# Patient Record
Sex: Female | Born: 1970 | Race: White | Hispanic: No | Marital: Single | Smoking: Former smoker
Health system: Southern US, Community
[De-identification: ages and names within clinical notes are randomized; demographics above are authoritative.]

## PROBLEM LIST (undated history)

## (undated) DIAGNOSIS — N83209 Unspecified ovarian cyst, unspecified side: Secondary | ICD-10-CM

## (undated) DIAGNOSIS — C50919 Malignant neoplasm of unspecified site of unspecified female breast: Secondary | ICD-10-CM

## (undated) HISTORY — PX: TUBAL LIGATION: SHX77

## (undated) HISTORY — PX: ABDOMINAL HYSTERECTOMY: SHX81

---

## 2004-09-26 ENCOUNTER — Emergency Department: Payer: Self-pay | Admitting: Emergency Medicine

## 2004-12-12 ENCOUNTER — Emergency Department: Payer: Self-pay | Admitting: Emergency Medicine

## 2005-02-11 ENCOUNTER — Emergency Department: Payer: Self-pay | Admitting: General Practice

## 2005-04-20 ENCOUNTER — Emergency Department: Payer: Self-pay | Admitting: General Practice

## 2008-07-21 ENCOUNTER — Emergency Department: Payer: Self-pay | Admitting: Internal Medicine

## 2009-06-17 ENCOUNTER — Emergency Department: Payer: Self-pay | Admitting: Emergency Medicine

## 2010-01-04 ENCOUNTER — Emergency Department: Payer: Self-pay | Admitting: Emergency Medicine

## 2010-02-26 ENCOUNTER — Ambulatory Visit: Payer: Self-pay | Admitting: Otolaryngology

## 2010-10-30 ENCOUNTER — Emergency Department: Payer: Self-pay | Admitting: Unknown Physician Specialty

## 2011-02-07 ENCOUNTER — Ambulatory Visit: Payer: Self-pay | Admitting: Family Medicine

## 2012-04-14 ENCOUNTER — Emergency Department: Payer: Self-pay | Admitting: Emergency Medicine

## 2012-04-14 LAB — COMPREHENSIVE METABOLIC PANEL
Albumin: 4.1 g/dL (ref 3.4–5.0)
Alkaline Phosphatase: 53 U/L (ref 50–136)
Anion Gap: 9 (ref 7–16)
Calcium, Total: 9.1 mg/dL (ref 8.5–10.1)
Chloride: 110 mmol/L — ABNORMAL HIGH (ref 98–107)
Co2: 21 mmol/L (ref 21–32)
Creatinine: 0.97 mg/dL (ref 0.60–1.30)
EGFR (Non-African Amer.): >60
Potassium: 4.7 mmol/L (ref 3.5–5.1)
Sodium: 140 mmol/L (ref 136–145)

## 2012-04-14 LAB — TROPONIN I: Troponin-I: <0.02 ng/mL

## 2012-04-14 LAB — CBC WITH DIFFERENTIAL/PLATELET
Basophil #: 0.1 10*3/uL (ref 0.0–0.1)
Eosinophil %: 2.7 %
HGB: 14.8 g/dL (ref 12.0–16.0)
MCH: 29.7 pg (ref 26.0–34.0)
MCHC: 33.5 g/dL (ref 32.0–36.0)
MCV: 89 fL (ref 80–100)
Monocyte #: 0.6 x10 3/mm (ref 0.2–0.9)
Platelet: 183 10*3/uL (ref 150–440)
RBC: 4.97 10*6/uL (ref 3.80–5.20)
RDW: 13.7 % (ref 11.5–14.5)

## 2012-04-14 LAB — URINALYSIS, COMPLETE
Bacteria: NONE SEEN
Blood: NEGATIVE
Leukocyte Esterase: NEGATIVE
Nitrite: NEGATIVE
Ph: 5 (ref 4.5–8.0)
Protein: NEGATIVE
RBC,UR: 1 /HPF (ref 0–5)
Squamous Epithelial: 1
WBC UR: <1 /HPF (ref 0–5)

## 2012-04-14 LAB — PREGNANCY, URINE: Pregnancy Test, Urine: NEGATIVE m[IU]/mL

## 2012-04-14 LAB — DRUG SCREEN, URINE
Amphetamines, Ur Screen: NEGATIVE (ref ?–1000)
Barbiturates, Ur Screen: NEGATIVE (ref ?–200)
Cannabinoid 50 Ng, Ur ~~LOC~~: NEGATIVE (ref ?–50)
Opiate, Ur Screen: NEGATIVE (ref ?–300)
Phencyclidine (PCP) Ur S: NEGATIVE (ref ?–25)
Tricyclic, Ur Screen: NEGATIVE (ref ?–1000)

## 2013-06-23 ENCOUNTER — Encounter: Payer: Self-pay | Admitting: *Deleted

## 2013-06-23 ENCOUNTER — Ambulatory Visit: Payer: Self-pay | Admitting: Family Medicine

## 2013-06-23 ENCOUNTER — Ambulatory Visit: Payer: Self-pay

## 2013-07-14 ENCOUNTER — Encounter: Payer: Self-pay | Admitting: General Surgery

## 2013-07-14 ENCOUNTER — Ambulatory Visit (INDEPENDENT_AMBULATORY_CARE_PROVIDER_SITE_OTHER): Payer: PRIVATE HEALTH INSURANCE | Admitting: General Surgery

## 2013-07-14 VITALS — BP 120/74 | HR 74 | Resp 12 | Ht 64.0 in | Wt 174.0 lb

## 2013-07-14 DIAGNOSIS — N63 Unspecified lump in unspecified breast: Secondary | ICD-10-CM | POA: Insufficient documentation

## 2013-07-14 NOTE — Patient Instructions (Signed)
Patient may take tylenol, advil, or aleve as needed for pain or discomfort. 

## 2013-07-14 NOTE — Progress Notes (Signed)
Patient ID: Caitlin Holt, female   DOB: 1971/10/18, 42 y.o.   MRN: [id number]  Chief Complaint  Patient presents with  . Other    mammogram    HPI Caitlin Holt is a 42 y.o. female who presents for a breast evaluation. The most recent mammogram and right breast  ultrasound was done on 06/23/13. Patient does perform regular self breast checks and gets regular mammograms done. Patient states she felt an lump on her right breast last year.  She states it is always hurting like an throbbing pain in the breasts.  HPI  History reviewed. No pertinent past medical history.  Past Surgical History  Procedure Laterality Date  . Tubal ligation      Family History  Problem Relation Age of Onset  . Breast cancer Mother   . Breast cancer Maternal Grandmother     Social History History  Substance Use Topics  . Smoking status: Current Every Day Smoker -- 1.00 packs/day for 15 years  . Smokeless tobacco: Never Used  . Alcohol Use: Yes    No Known Allergies  No current outpatient prescriptions on file.   No current facility-administered medications for this visit.    Review of Systems Review of Systems  Constitutional: Negative.   Respiratory: Negative.   Cardiovascular: Negative.     Blood pressure 120/74, pulse 74, resp. rate 12, height 5\' 4"  (1.626 m), weight 174 lb (78.926 kg), last menstrual period 07/03/2013.  Physical Exam Physical Exam  Constitutional: She is oriented to person, place, and time. She appears well-developed and well-nourished.  Neck: Neck supple.  Cardiovascular: Normal rate and regular rhythm.   Pulmonary/Chest: Breath sounds normal. Right breast exhibits no inverted nipple, no mass, no nipple discharge, no skin change and no tenderness. Left breast exhibits no inverted nipple, no mass, no nipple discharge, no skin change and no tenderness. Breasts are asymmetrical (left breast 1 cup size bigger than right).  Mild thickening in the upper outer  quadrant right breast.Left breast thickening in the upper outer quadrant.  Lymphadenopathy:    She has no cervical adenopathy.    She has no axillary adenopathy.  Neurological: She is alert and oriented to person, place, and time.  Skin: Skin is warm and dry.    Data Reviewed Bilateral mammogram dated June 23, 2013 including focal spot compression dated July 01, 2013 views of the right breast showed no discernible abnormality  Her ultrasound dated June 29, 2013 identified no abnormality.  Assessment    Benign breast exam, focal symmetrical a broader quadrant thickening associated with normal breast parenchyma.     Plan    The patient was encouraged to continue  Monthly breast self examination as well as annual screening mammograms.       Earline Mayotte 07/14/2013, 10:35 PM

## 2013-08-30 ENCOUNTER — Emergency Department: Payer: Self-pay | Admitting: Emergency Medicine

## 2014-01-29 ENCOUNTER — Emergency Department: Payer: Self-pay | Admitting: Emergency Medicine

## 2014-02-20 ENCOUNTER — Emergency Department: Payer: Self-pay | Admitting: Emergency Medicine

## 2014-02-20 LAB — URINALYSIS, COMPLETE
BILIRUBIN, UR: NEGATIVE
Bacteria: NONE SEEN
Blood: NEGATIVE
Glucose,UR: NEGATIVE mg/dL (ref 0–75)
Ketone: NEGATIVE
LEUKOCYTE ESTERASE: NEGATIVE
Nitrite: NEGATIVE
PROTEIN: NEGATIVE
Ph: 5 (ref 4.5–8.0)
RBC,UR: <1 /HPF (ref 0–5)
Specific Gravity: 1.016 (ref 1.003–1.030)
WBC UR: <1 /HPF (ref 0–5)

## 2014-07-27 ENCOUNTER — Ambulatory Visit: Payer: Self-pay

## 2014-10-03 ENCOUNTER — Encounter: Payer: Self-pay | Admitting: General Surgery

## 2015-08-02 ENCOUNTER — Ambulatory Visit: Payer: Self-pay | Attending: Oncology

## 2015-08-02 ENCOUNTER — Ambulatory Visit
Admission: RE | Admit: 2015-08-02 | Discharge: 2015-08-02 | Disposition: A | Discharge status: Home / Self Care | Payer: Self-pay | Source: Ambulatory Visit | Attending: Oncology | Admitting: Oncology

## 2015-08-02 ENCOUNTER — Encounter (INDEPENDENT_AMBULATORY_CARE_PROVIDER_SITE_OTHER): Payer: Self-pay

## 2015-08-02 VITALS — BP 122/81 | HR 63 | Temp 98.1°F | Resp 18 | Ht 64.57 in | Wt 203.0 lb

## 2015-08-02 DIAGNOSIS — Z0000 Encounter for general adult medical examination without abnormal findings: Secondary | ICD-10-CM

## 2015-08-02 NOTE — Progress Notes (Signed)
Subjective:     Patient ID: Caitlin Holt, female   DOB: 1971/06/30, 44 y.o.   MRN: [id number]  HPI   Review of Systems     Objective:   Physical Exam  Pulmonary/Chest: Right breast exhibits no inverted nipple, no mass, no nipple discharge, no skin change and no tenderness. Left breast exhibits no inverted nipple, no mass, no nipple discharge, no skin change and no tenderness. Breasts are symmetrical.       Assessment:     44 year old  patient presents for [facility] clinic visit.  Patient screened, and meets BCCCP eligibility.  Patient does not have insurance, Medicare or Medicaid.  Handout given on Affordable Care Act.  CBE unremarkable.  Instructed patient on breast self-exam using teach back method Patient is being seen at [facility] for uterine fibroids, and left ovarian cyst.  Mother is breast cancer survivor, and tested negative for BRCA mutation.    Plan:     Sent for bilateral screening mammogram.

## 2015-08-04 NOTE — Progress Notes (Signed)
Patient ID: Caitlin Holt, female   DOB: [birth year], 44 y.o.   MRN: [id number] Letter mailed from [facility] to notify of normal mammogram results.  Patient to return in one year for annual screening.  Copy to [facility].

## 2015-12-26 ENCOUNTER — Emergency Department
Admission: EM | Admit: 2015-12-26 | Discharge: 2015-12-26 | Disposition: A | Discharge status: Home / Self Care | Payer: Self-pay | Attending: Emergency Medicine | Admitting: Emergency Medicine

## 2015-12-26 ENCOUNTER — Encounter: Payer: Self-pay | Admitting: Emergency Medicine

## 2015-12-26 ENCOUNTER — Emergency Department: Payer: Self-pay

## 2015-12-26 DIAGNOSIS — Z3202 Encounter for pregnancy test, result negative: Secondary | ICD-10-CM | POA: Insufficient documentation

## 2015-12-26 DIAGNOSIS — R102 Pelvic and perineal pain: Secondary | ICD-10-CM

## 2015-12-26 DIAGNOSIS — R531 Weakness: Secondary | ICD-10-CM | POA: Insufficient documentation

## 2015-12-26 DIAGNOSIS — D259 Leiomyoma of uterus, unspecified: Secondary | ICD-10-CM | POA: Insufficient documentation

## 2015-12-26 DIAGNOSIS — F17200 Nicotine dependence, unspecified, uncomplicated: Secondary | ICD-10-CM | POA: Insufficient documentation

## 2015-12-26 DIAGNOSIS — N83202 Unspecified ovarian cyst, left side: Secondary | ICD-10-CM | POA: Insufficient documentation

## 2015-12-26 DIAGNOSIS — R42 Dizziness and giddiness: Secondary | ICD-10-CM | POA: Insufficient documentation

## 2015-12-26 HISTORY — DX: Unspecified ovarian cyst, unspecified side: N83.209

## 2015-12-26 LAB — BASIC METABOLIC PANEL
ANION GAP: 4 — AB (ref 5–15)
BUN: 15 mg/dL (ref 6–20)
CALCIUM: 9.1 mg/dL (ref 8.9–10.3)
CO2: 25 mmol/L (ref 22–32)
Chloride: 109 mmol/L (ref 101–111)
Creatinine, Ser: 1 mg/dL (ref 0.44–1.00)
GFR calc Af Amer: >60 mL/min (ref >60)
Glucose, Bld: 119 mg/dL — ABNORMAL HIGH (ref 65–99)
POTASSIUM: 4 mmol/L (ref 3.5–5.1)
SODIUM: 138 mmol/L (ref 135–145)

## 2015-12-26 LAB — CBC WITH DIFFERENTIAL/PLATELET
BASOS ABS: 0 10*3/uL (ref 0–0.1)
BASOS PCT: 1 %
EOS PCT: 1 %
Eosinophils Absolute: 0.1 10*3/uL (ref 0–0.7)
HCT: 43.7 % (ref 35.0–47.0)
Hemoglobin: 14.6 g/dL (ref 12.0–16.0)
LYMPHS PCT: 14 %
Lymphs Abs: 1.1 10*3/uL (ref 1.0–3.6)
MCH: 28.5 pg (ref 26.0–34.0)
MCHC: 33.4 g/dL (ref 32.0–36.0)
MCV: 85.5 fL (ref 80.0–100.0)
MONO ABS: 0.6 10*3/uL (ref 0.2–0.9)
Monocytes Relative: 7 %
Neutro Abs: 6 10*3/uL (ref 1.4–6.5)
Neutrophils Relative %: 77 %
PLATELETS: 172 10*3/uL (ref 150–440)
RBC: 5.11 MIL/uL (ref 3.80–5.20)
RDW: 14.4 % (ref 11.5–14.5)
WBC: 7.8 10*3/uL (ref 3.6–11.0)

## 2015-12-26 LAB — URINALYSIS COMPLETE WITH MICROSCOPIC (ARMC ONLY)
BILIRUBIN URINE: NEGATIVE
Glucose, UA: NEGATIVE mg/dL
KETONES UR: NEGATIVE mg/dL
LEUKOCYTES UA: NEGATIVE
NITRITE: NEGATIVE
PH: 6 (ref 5.0–8.0)
PROTEIN: NEGATIVE mg/dL
SPECIFIC GRAVITY, URINE: 1.002 — AB (ref 1.005–1.030)

## 2015-12-26 LAB — POCT PREGNANCY, URINE: Preg Test, Ur: NEGATIVE

## 2015-12-26 MED ORDER — SODIUM CHLORIDE 0.9 % IV SOLN
1000.0000 mL | Freq: Once | INTRAVENOUS | Status: AC
  Administered 2015-12-26: 1000 mL via INTRAVENOUS

## 2015-12-26 MED ORDER — MORPHINE SULFATE (PF) 4 MG/ML IV SOLN
INTRAVENOUS | Status: AC
  Administered 2015-12-26: 4 mg via INTRAVENOUS
  Filled 2015-12-26: qty 1

## 2015-12-26 MED ORDER — MORPHINE SULFATE (PF) 4 MG/ML IV SOLN
4.0000 mg | Freq: Once | INTRAVENOUS | Status: AC
  Administered 2015-12-26: 4 mg via INTRAVENOUS

## 2015-12-26 MED ORDER — OXYCODONE-ACETAMINOPHEN 5-325 MG PO TABS: 2.0000 | ORAL_TABLET | Freq: Four times a day (QID) | ORAL | Status: DC | PRN

## 2015-12-26 MED ORDER — HYDROMORPHONE HCL 1 MG/ML IJ SOLN
1.0000 mg | Freq: Once | INTRAMUSCULAR | Status: AC
  Administered 2015-12-26: 1 mg via INTRAVENOUS
  Filled 2015-12-26: qty 1

## 2015-12-26 NOTE — Discharge Instructions (Signed)
Pelvic Pain, Female Female pelvic pain can be caused by many different things and start from a variety of places. Pelvic pain refers to pain that is located in the lower half of the abdomen and between your hips. The pain may occur over a short period of time (acute) or may be reoccurring (chronic). The cause of pelvic pain may be related to disorders affecting the female reproductive organs (gynecologic), but it may also be related to the bladder, kidney stones, an intestinal complication, or muscle or skeletal problems. Getting help right away for pelvic pain is important, especially if there has been severe, sharp, or a sudden onset of unusual pain. It is also important to get help right away because some types of pelvic pain can be life threatening.  CAUSES  Below are only some of the causes of pelvic pain. The causes of pelvic pain can be in one of several categories.   Gynecologic.  Pelvic inflammatory disease.  Sexually transmitted infection.  Ovarian cyst or a twisted ovarian ligament (ovarian torsion).  Uterine lining that grows outside the uterus (endometriosis).  Fibroids, cysts, or tumors.  Ovulation.  Pregnancy.  Pregnancy that occurs outside the uterus (ectopic pregnancy).  Miscarriage.  Labor.  Abruption of the placenta or ruptured uterus.  Infection.  Uterine infection (endometritis).  Bladder infection.  Diverticulitis.  Miscarriage related to a uterine infection (septic abortion).  Bladder.  Inflammation of the bladder (cystitis).  Kidney stone(s).  Gastrointestinal.  Constipation.  Diverticulitis.  Neurologic.  Trauma.  Feeling pelvic pain because of mental or emotional causes (psychosomatic).  Cancers of the bowel or pelvis. EVALUATION  Your caregiver will want to take a careful history of your concerns. This includes recent changes in your health, a careful gynecologic history of your periods (menses), and a sexual history. Obtaining  your family history and medical history is also important. Your caregiver may suggest a pelvic exam. A pelvic exam will help identify the location and severity of the pain. It also helps in the evaluation of which organ system may be involved. In order to identify the cause of the pelvic pain and be properly treated, your caregiver may order tests. These tests may include:   A pregnancy test.  Pelvic ultrasonography.  An X-ray exam of the abdomen.  A urinalysis or evaluation of vaginal discharge.  Blood tests. HOME CARE INSTRUCTIONS   Only take over-the-counter or prescription medicines for pain, discomfort, or fever as directed by your caregiver.   Rest as directed by your caregiver.   Eat a balanced diet.   Drink enough fluids to make your urine clear or pale yellow, or as directed.   Avoid sexual intercourse if it causes pain.   Apply warm or cold compresses to the lower abdomen depending on which one helps the pain.   Avoid stressful situations.   Keep a journal of your pelvic pain. Write down when it started, where the pain is located, and if there are things that seem to be associated with the pain, such as food or your menstrual cycle.  Follow up with your caregiver as directed.  SEEK MEDICAL CARE IF:  Your medicine does not help your pain.  You have abnormal vaginal discharge. SEEK IMMEDIATE MEDICAL CARE IF:   You have heavy bleeding from the vagina.   Your pelvic pain increases.   You feel light-headed or faint.   You have chills.   You have pain with urination or blood in your urine.   You have uncontrolled  diarrhea or vomiting.   You have a fever or persistent symptoms for more than 3 days.  You have a fever and your symptoms suddenly get worse.   You are being physically or sexually abused.   This information is not intended to replace advice given to you by your health care provider. Make sure you discuss any questions you have with  your health care provider.   Document Released: 10/15/2004 Document Revised: 08/09/2015 Document Reviewed: 03/09/2012 Elsevier Interactive Patient Education 2016 Elsevier Inc. Ovarian Cyst An ovarian cyst is a fluid-filled sac that forms on an ovary. The ovaries are small organs that produce eggs in women. Various types of cysts can form on the ovaries. Most are not cancerous. Many do not cause problems, and they often go away on their own. Some may cause symptoms and require treatment. Common types of ovarian cysts include:  Functional cysts--These cysts may occur every month during the menstrual cycle. This is normal. The cysts usually go away with the next menstrual cycle if the woman does not get pregnant. Usually, there are no symptoms with a functional cyst.  Endometrioma cysts--These cysts form from the tissue that lines the uterus. They are also called "chocolate cysts" because they become filled with blood that turns brown. This type of cyst can cause pain in the lower abdomen during intercourse and with your menstrual period.  Cystadenoma cysts--This type develops from the cells on the outside of the ovary. These cysts can get very big and cause lower abdomen pain and pain with intercourse. This type of cyst can twist on itself, cut off its blood supply, and cause severe pain. It can also easily rupture and cause a lot of pain.  Dermoid cysts--This type of cyst is sometimes found in both ovaries. These cysts may contain different kinds of body tissue, such as skin, teeth, hair, or cartilage. They usually do not cause symptoms unless they get very big.  Theca lutein cysts--These cysts occur when too much of a certain hormone (human chorionic gonadotropin) is produced and overstimulates the ovaries to produce an egg. This is most common after procedures used to assist with the conception of a baby (in vitro fertilization). CAUSES   Fertility drugs can cause a condition in which multiple  large cysts are formed on the ovaries. This is called ovarian hyperstimulation syndrome.  A condition called polycystic ovary syndrome can cause hormonal imbalances that can lead to nonfunctional ovarian cysts. SIGNS AND SYMPTOMS  Many ovarian cysts do not cause symptoms. If symptoms are present, they may include:  Pelvic pain or pressure.  Pain in the lower abdomen.  Pain during sexual intercourse.  Increasing girth (swelling) of the abdomen.  Abnormal menstrual periods.  Increasing pain with menstrual periods.  Stopping having menstrual periods without being pregnant. DIAGNOSIS  These cysts are commonly found during a routine or annual pelvic exam. Tests may be ordered to find out more about the cyst. These tests may include:  Ultrasound.  X-ray of the pelvis.  CT scan.  MRI.  Blood tests. TREATMENT  Many ovarian cysts go away on their own without treatment. Your health care provider may want to check your cyst regularly for 2-3 months to see if it changes. For women in menopause, it is particularly important to monitor a cyst closely because of the higher rate of ovarian cancer in menopausal women. When treatment is needed, it may include any of the following:  A procedure to drain the cyst (aspiration). This may be  done using a long needle and ultrasound. It can also be done through a laparoscopic procedure. This involves using a thin, lighted tube with a tiny camera on the end (laparoscope) inserted through a small incision.  Surgery to remove the whole cyst. This may be done using laparoscopic surgery or an open surgery involving a larger incision in the lower abdomen.  Hormone treatment or birth control pills. These methods are sometimes used to help dissolve a cyst. HOME CARE INSTRUCTIONS   Only take over-the-counter or prescription medicines as directed by your health care provider.  Follow up with your health care provider as directed.  Get regular pelvic exams  and Pap tests. SEEK MEDICAL CARE IF:   Your periods are late, irregular, or painful, or they stop.  Your pelvic pain or abdominal pain does not go away.  Your abdomen becomes larger or swollen.  You have pressure on your bladder or trouble emptying your bladder completely.  You have pain during sexual intercourse.  You have feelings of fullness, pressure, or discomfort in your stomach.  You lose weight for no apparent reason.  You feel generally ill.  You become constipated.  You lose your appetite.  You develop acne.  You have an increase in body and facial hair.  You are gaining weight, without changing your exercise and eating habits.  You think you are pregnant. SEEK IMMEDIATE MEDICAL CARE IF:   You have increasing abdominal pain.  You feel sick to your stomach (nauseous), and you throw up (vomit).  You develop a fever that comes on suddenly.  You have abdominal pain during a bowel movement.  Your menstrual periods become heavier than usual. MAKE SURE YOU:  Understand these instructions.  Will watch your condition.  Will get help right away if you are not doing well or get worse.   This information is not intended to replace advice given to you by your health care provider. Make sure you discuss any questions you have with your health care provider.   Document Released: 11/18/2005 Document Revised: 11/23/2013 Document Reviewed: 07/26/2013 Elsevier Interactive Patient Education [employer].

## 2015-12-26 NOTE — ED Provider Notes (Signed)
[facility] Emergency Department Provider Note     Time seen: ----------------------------------------- 11:38 AM on 12/26/2015 -----------------------------------------    I have reviewed the triage vital signs and the nursing notes.   HISTORY  Chief Complaint Dizziness    HPI Caitlin Holt is a 45 y.o. female who presents ER for dizziness that was worse with standing this morning. Patient worse decrease appetite nausea and pelvic pain. Patient was told she had a cyst on her ovary last week and she's been unable to hold fluids down very well. She denies fevers, chills, chest pain, shortness of breath or diarrhea. Patient has had some nausea.   Past Medical History  Diagnosis Date  . Ovarian cyst     Patient Active Problem List   Diagnosis Date Noted  . Lump or mass in breast 07/14/2013    Past Surgical History  Procedure Laterality Date  . Tubal ligation      Allergies Review of patient's allergies indicates no known allergies.  Social History Social History  Substance Use Topics  . Smoking status: Current Every Day Smoker -- 1.00 packs/day for 15 years  . Smokeless tobacco: Never Used  . Alcohol Use: Yes    Review of Systems Constitutional: Negative for fever. Eyes: Negative for visual changes. ENT: Negative for sore throat. Cardiovascular: Negative for chest pain. Respiratory: Negative for shortness of breath. Gastrointestinal: Positive for pelvic pain Genitourinary: Negative for dysuria. Musculoskeletal: Negative for back pain. Skin: Negative for rash. Neurological: Negative for headaches, positive for weakness and dizziness  10-point ROS otherwise negative.  ____________________________________________   PHYSICAL EXAM:  VITAL SIGNS: ED Triage Vitals  Enc Vitals Group     BP 12/26/15 1039 136/77 mmHg     Pulse Rate 12/26/15 1039 73     Resp 12/26/15 1039 20     Temp 12/26/15 1039 98.2 F (36.8 C)     Temp  Source 12/26/15 1039 Oral     SpO2 12/26/15 1039 100 %     Weight 12/26/15 1039 194 lb (87.998 kg)     Height 12/26/15 1039 5\' 4"  (1.626 m)     Head Cir --      Peak Flow --      Pain Score 12/26/15 1039 10     Pain Loc --      Pain Edu? --      Excl. in [place]? --     Constitutional: Alert and oriented. Well appearing and in no distress. Eyes: Conjunctivae are normal. PERRL. Normal extraocular movements. ENT   Head: Normocephalic and atraumatic.   Nose: No congestion/rhinnorhea.   Mouth/Throat: Mucous membranes are dry   Neck: No stridor. Cardiovascular: Normal rate, regular rhythm. Normal and symmetric distal pulses are present in all extremities. No murmurs, rubs, or gallops. Respiratory: Normal respiratory effort without tachypnea nor retractions. Breath sounds are clear and equal bilaterally. No wheezes/rales/rhonchi. Gastrointestinal: Left lower quadrant tenderness, no rebound or guarding. Normal bowel sounds. Musculoskeletal: Nontender with normal range of motion in all extremities. No joint effusions.  No lower extremity tenderness nor edema. Neurologic:  Normal speech and language. No gross focal neurologic deficits are appreciated. Speech is normal. No gait instability. Skin:  Skin is warm, dry and intact. No rash noted. Psychiatric: Mood and affect are normal. Speech and behavior are normal. Patient exhibits appropriate insight and judgment. ____________________________________________  EKG: Interpreted by me. Sinus bradycardia with rate of 57 bpm, normal PR interval, normal QRS, normal QT interval.  ____________________________________________  ED COURSE:  Pertinent labs & imaging results that were available during my care of the patient were reviewed by me and considered in my medical decision making (see chart for details). Patient does appear dehydrated, will give IV fluids and reevaluate. ____________________________________________    LABS (pertinent  positives/negatives) Labs Reviewed  POC URINE PREG, ED  POCT PREGNANCY, URINE  POC URINE PREG, ED     RADIOLOGY  Pelvic ultrasound IMPRESSION: 3.9 cm left ovarian cyst which appears simple. No evidence of torsion.  Uterine fibroid as above. ____________________________________________  FINAL ASSESSMENT AND PLAN  Dizziness, pelvic pain, ovarian cyst, uterine fibroid  Plan: Patient with labs and imaging as dictated above. Patient with chronic pelvic pain secondary to ovary and cyst and uterine fibroid. She does have outpatient follow-up scheduled with OB/GYN. I will prescribe Percocet for pain. She is stable for outpatient follow-up.   Earleen Newport, MD   Earleen Newport, MD 12/26/15 [phone]

## 2015-12-26 NOTE — ED Notes (Signed)
Pt to ed with c/o decreased appetite, nausea and pain in pelvic area.  Pt states she was told she had a cyst on her ovary last week and since has been unable to hold down fluids, therefore when she stands she feels dizzy.  Pt skin warm and dry at triage at this time.

## 2015-12-26 NOTE — ED Notes (Signed)
u-preg. Was neg.

## 2016-02-13 ENCOUNTER — Encounter: Payer: Self-pay | Admitting: Emergency Medicine

## 2016-02-13 ENCOUNTER — Emergency Department
Admission: EM | Admit: 2016-02-13 | Discharge: 2016-02-13 | Disposition: A | Discharge status: Home / Self Care | Payer: Self-pay | Attending: Student | Admitting: Student

## 2016-02-13 ENCOUNTER — Emergency Department: Payer: Self-pay

## 2016-02-13 DIAGNOSIS — F17200 Nicotine dependence, unspecified, uncomplicated: Secondary | ICD-10-CM | POA: Insufficient documentation

## 2016-02-13 DIAGNOSIS — Y9289 Other specified places as the place of occurrence of the external cause: Secondary | ICD-10-CM | POA: Insufficient documentation

## 2016-02-13 DIAGNOSIS — W182XXA Fall in (into) shower or empty bathtub, initial encounter: Secondary | ICD-10-CM | POA: Insufficient documentation

## 2016-02-13 DIAGNOSIS — S8011XA Contusion of right lower leg, initial encounter: Secondary | ICD-10-CM | POA: Insufficient documentation

## 2016-02-13 DIAGNOSIS — Y9389 Activity, other specified: Secondary | ICD-10-CM | POA: Insufficient documentation

## 2016-02-13 DIAGNOSIS — S80811A Abrasion, right lower leg, initial encounter: Secondary | ICD-10-CM | POA: Insufficient documentation

## 2016-02-13 DIAGNOSIS — Y998 Other external cause status: Secondary | ICD-10-CM | POA: Insufficient documentation

## 2016-02-13 MED ORDER — IBUPROFEN 800 MG PO TABS: 800.0000 mg | ORAL_TABLET | Freq: Three times a day (TID) | ORAL | Status: DC | PRN

## 2016-02-13 MED ORDER — OXYCODONE-ACETAMINOPHEN 5-325 MG PO TABS
1.0000 | ORAL_TABLET | Freq: Once | ORAL | Status: AC
  Administered 2016-02-13: 1 via ORAL
  Filled 2016-02-13: qty 1

## 2016-02-13 MED ORDER — IBUPROFEN 800 MG PO TABS
800.0000 mg | ORAL_TABLET | Freq: Once | ORAL | Status: AC
  Administered 2016-02-13: 800 mg via ORAL
  Filled 2016-02-13: qty 1

## 2016-02-13 MED ORDER — OXYCODONE-ACETAMINOPHEN 5-325 MG PO TABS: 1.0000 | ORAL_TABLET | Freq: Four times a day (QID) | ORAL | Status: DC | PRN

## 2016-02-13 NOTE — ED Notes (Signed)
Pt in via triage; pt reports falling this morning when getting into bath tub. Pt with bruise, swelling to RLE; pt reports shooting pains from right knee to ankle which have not resolved since the fall, pt reports numbness to that leg as well.   Pt denies hitting head, denies LOC.  Pt A/Ox4, no immediate distress at this time.

## 2016-02-13 NOTE — Discharge Instructions (Signed)
Advised wear Ace wrap for 3-5 days as needed.

## 2016-02-13 NOTE — ED Notes (Signed)
States she fell this am in tub  Having pain to right lower leg

## 2016-02-13 NOTE — ED Provider Notes (Signed)
[facility] Emergency Department Provider Note  ____________________________________________  Time seen: Approximately 7:20 PM  I have reviewed the triage vital signs and the nursing notes.   HISTORY  Chief Complaint Fall    HPI Caitlin Holt is a 45 y.o. female patient complain of mid to lower right leg pain secondary to a fall. Patient state this morning while getting out of she fell. Patient states she has pain radiating from the knee to the ankle. Patient state there is some transient numbness to the leg which started prior to arrival. Patient stated no relief taking ibuprofen at home.Patient rates the pain as a 7/10. Patient described a pain as "sharp".   Past Medical History  Diagnosis Date  . Ovarian cyst     Patient Active Problem List   Diagnosis Date Noted  . Lump or mass in breast 07/14/2013    Past Surgical History  Procedure Laterality Date  . Tubal ligation      Current Outpatient Rx  Name  Route  Sig  Dispense  Refill  . oxyCODONE-acetaminophen (PERCOCET) 5-325 MG tablet   Oral   Take 2 tablets by mouth every 6 (six) hours as needed for moderate pain or severe pain.   20 tablet   0     Allergies Review of patient's allergies indicates no known allergies.  Family History  Problem Relation Age of Onset  . Breast cancer Mother   . Breast cancer Maternal Grandmother   . Breast cancer Maternal Aunt     Social History Social History  Substance Use Topics  . Smoking status: Current Every Day Smoker -- 1.00 packs/day for 15 years  . Smokeless tobacco: Never Used  . Alcohol Use: Yes    Review of Systems Constitutional: No fever/chills Eyes: No visual changes. ENT: No sore throat. Cardiovascular: Denies chest pain. Respiratory: Denies shortness of breath. Gastrointestinal: No abdominal pain.  No nausea, no vomiting.  No diarrhea.  No constipation. Genitourinary: Negative for dysuria. Musculoskeletal: Right lower  leg pain  Skin: Negative for rash. Neurological: Negative for headaches, focal weakness or numbness.    ____________________________________________   PHYSICAL EXAM:  VITAL SIGNS: ED Triage Vitals  Enc Vitals Group     BP 02/13/16 1804 144/82 mmHg     Pulse Rate 02/13/16 1804 96     Resp 02/13/16 1804 20     Temp 02/13/16 1804 98.4 F (36.9 C)     Temp Source 02/13/16 1804 Oral     SpO2 02/13/16 1804 98 %     Weight 02/13/16 1804 189 lb (85.73 kg)     Height 02/13/16 1804 5\' 4"  (1.626 m)     Head Cir --      Peak Flow --      Pain Score 02/13/16 1805 7     Pain Loc --      Pain Edu? --      Excl. in [place]? --     Constitutional: Alert and oriented. Well appearing and in no acute distress. Eyes: Conjunctivae are normal. PERRL. EOMI. Head: Atraumatic. Nose: No congestion/rhinnorhea. Mouth/Throat: Mucous membranes are moist.  Oropharynx non-erythematous. Neck: No stridor.  No cervical spine tenderness to palpation. Hematological/Lymphatic/Immunilogical: No cervical lymphadenopathy. Cardiovascular: Normal rate, regular rhythm. Grossly normal heart sounds.  Good peripheral circulation. Respiratory: Normal respiratory effort.  No retractions. Lungs CTAB. Gastrointestinal: Soft and nontender. No distention. No abdominal bruits. No CVA tenderness. Musculoskeletal: No obvious deformities to the right lower leg. Patient has abrasion and mild  edema to the medial aspect of the lower leg. Patient was to ambulate with atypical gait. Distal pulses are intact. Neurologic:  Normal speech and language. No gross focal neurologic deficits are appreciated. No gait instability. Skin:  Skin is warm, dry and intact. No rash noted. Psychiatric: Mood and affect are normal. Speech and behavior are normal.  ____________________________________________   LABS (all labs ordered are listed, but only abnormal results are displayed)  Labs Reviewed - No data to  display ____________________________________________  EKG   ____________________________________________  RADIOLOGY  No acute findings on x-ray. ____________________________________________   PROCEDURES  Procedure(s) performed: None  Critical Care performed: No  ____________________________________________   INITIAL IMPRESSION / ASSESSMENT AND PLAN / ED COURSE  Pertinent labs & imaging results that were available during my care of the patient were reviewed by me and considered in my medical decision making (see chart for details).  Periosteal Hematoma. Discussed x-ray finding with patient. Patient given discharge care instructions. Patient given a prescription for Percocet and ibuprofen. Patient given a work note. Patient advised [facility]. ____________________________________________   FINAL CLINICAL IMPRESSION(S) / ED DIAGNOSES  Final diagnoses:  None      Sable Feil, PA-C 02/13/16 [id number]  Joanne Gavel, MD 02/13/16 [id number]

## 2016-04-11 ENCOUNTER — Emergency Department
Admission: EM | Admit: 2016-04-11 | Discharge: 2016-04-11 | Discharge status: Against Medical Advice | Payer: Self-pay | Attending: Emergency Medicine | Admitting: Emergency Medicine

## 2016-04-11 ENCOUNTER — Encounter: Payer: Self-pay | Admitting: Emergency Medicine

## 2016-04-11 DIAGNOSIS — Z5321 Procedure and treatment not carried out due to patient leaving prior to being seen by health care provider: Secondary | ICD-10-CM | POA: Insufficient documentation

## 2016-04-11 DIAGNOSIS — Z791 Long term (current) use of non-steroidal anti-inflammatories (NSAID): Secondary | ICD-10-CM | POA: Insufficient documentation

## 2016-04-11 DIAGNOSIS — F17210 Nicotine dependence, cigarettes, uncomplicated: Secondary | ICD-10-CM | POA: Insufficient documentation

## 2016-04-11 LAB — COMPREHENSIVE METABOLIC PANEL
ALK PHOS: 58 U/L (ref 38–126)
ALT: 21 U/L (ref 14–54)
ANION GAP: 6 (ref 5–15)
AST: 19 U/L (ref 15–41)
Albumin: 4.4 g/dL (ref 3.5–5.0)
BUN: 15 mg/dL (ref 6–20)
CALCIUM: 9.5 mg/dL (ref 8.9–10.3)
CHLORIDE: 107 mmol/L (ref 101–111)
CO2: 27 mmol/L (ref 22–32)
Creatinine, Ser: 0.91 mg/dL (ref 0.44–1.00)
GFR calc non Af Amer: >60 mL/min (ref >60)
GLUCOSE: 90 mg/dL (ref 65–99)
POTASSIUM: 3.9 mmol/L (ref 3.5–5.1)
SODIUM: 140 mmol/L (ref 135–145)
Total Bilirubin: 0.5 mg/dL (ref 0.3–1.2)
Total Protein: 7.4 g/dL (ref 6.5–8.1)

## 2016-04-11 LAB — URINALYSIS COMPLETE WITH MICROSCOPIC (ARMC ONLY)
BACTERIA UA: NONE SEEN
BILIRUBIN URINE: NEGATIVE
GLUCOSE, UA: NEGATIVE mg/dL
HGB URINE DIPSTICK: NEGATIVE
Ketones, ur: NEGATIVE mg/dL
Leukocytes, UA: NEGATIVE
Nitrite: NEGATIVE
PH: 5 (ref 5.0–8.0)
Protein, ur: NEGATIVE mg/dL
Specific Gravity, Urine: 1.025 (ref 1.005–1.030)

## 2016-04-11 LAB — POCT PREGNANCY, URINE: PREG TEST UR: NEGATIVE

## 2016-04-11 LAB — CBC
HEMATOCRIT: 43.5 % (ref 35.0–47.0)
Hemoglobin: 14.6 g/dL (ref 12.0–16.0)
MCH: 29.2 pg (ref 26.0–34.0)
MCHC: 33.7 g/dL (ref 32.0–36.0)
MCV: 86.8 fL (ref 80.0–100.0)
PLATELETS: 192 10*3/uL (ref 150–440)
RBC: 5.01 MIL/uL (ref 3.80–5.20)
RDW: 14 % (ref 11.5–14.5)
WBC: 8.3 10*3/uL (ref 3.6–11.0)

## 2016-04-11 LAB — LIPASE, BLOOD: LIPASE: 22 U/L (ref 11–51)

## 2016-04-11 NOTE — ED Notes (Signed)
Went into room to assess patient.  Patient gone.  Unable to locate patient.  Assume LWBS.

## 2016-04-11 NOTE — ED Notes (Signed)
Pt presents to ED with abdominal bloating and pain and low back pain. Pt reports decreased appetite. Pt denies dysuria. Pt denies nausea, vomiting and diarrhea. Pt reports last bowel movement yesterday. Pt denies flatulence. Pt states has planned lap hysterectomy next week.

## 2017-05-16 ENCOUNTER — Emergency Department
Admission: EM | Admit: 2017-05-16 | Discharge: 2017-05-16 | Disposition: A | Discharge status: Home / Self Care | Payer: Self-pay | Attending: Emergency Medicine | Admitting: Emergency Medicine

## 2017-05-16 ENCOUNTER — Encounter: Payer: Self-pay | Admitting: Emergency Medicine

## 2017-05-16 ENCOUNTER — Emergency Department: Payer: Self-pay

## 2017-05-16 DIAGNOSIS — M25551 Pain in right hip: Secondary | ICD-10-CM | POA: Insufficient documentation

## 2017-05-16 DIAGNOSIS — Z79899 Other long term (current) drug therapy: Secondary | ICD-10-CM | POA: Insufficient documentation

## 2017-05-16 DIAGNOSIS — F17210 Nicotine dependence, cigarettes, uncomplicated: Secondary | ICD-10-CM | POA: Insufficient documentation

## 2017-05-16 MED ORDER — DEXAMETHASONE SODIUM PHOSPHATE 10 MG/ML IJ SOLN
INTRAMUSCULAR | Status: AC
  Administered 2017-05-16: 10 mg via INTRAMUSCULAR
  Filled 2017-05-16: qty 1

## 2017-05-16 MED ORDER — TRAMADOL HCL 50 MG PO TABS: 50.0000 mg | ORAL_TABLET | Freq: Four times a day (QID) | ORAL | 0 refills | Status: AC | PRN

## 2017-05-16 MED ORDER — ORPHENADRINE CITRATE 30 MG/ML IJ SOLN
60.0000 mg | Freq: Two times a day (BID) | INTRAMUSCULAR | Status: DC
  Administered 2017-05-16: 60 mg via INTRAMUSCULAR
  Filled 2017-05-16: qty 2

## 2017-05-16 MED ORDER — PREDNISONE 10 MG (21) PO TBPK: ORAL_TABLET | ORAL | 0 refills | Status: DC

## 2017-05-16 MED ORDER — CYCLOBENZAPRINE HCL 5 MG PO TABS: 5.0000 mg | ORAL_TABLET | Freq: Three times a day (TID) | ORAL | 0 refills | Status: DC | PRN

## 2017-05-16 MED ORDER — DEXAMETHASONE SODIUM PHOSPHATE 10 MG/ML IJ SOLN
10.0000 mg | Freq: Once | INTRAMUSCULAR | Status: AC
  Administered 2017-05-16: 10 mg via INTRAMUSCULAR

## 2017-05-16 NOTE — ED Triage Notes (Signed)
Pt arrives POV to triage with right hip that pain that started around 1400. Pt reports no injury or trauma. Pt also states that pain is radiating down the right leg and she cannot move it at all at this point.

## 2017-05-16 NOTE — ED Provider Notes (Signed)
[facility] Emergency Department Provider Note   ____________________________________________   I have reviewed the triage vital signs and the nursing notes.   HISTORY  Chief Complaint Hip Pain    HPI Caitlin Holt is a 46 y.o. female presents with right hip pain that started when she sat up from a lounge chair at the pool today. Patient reported right hip pain has worsened to the point where she is unable to bear weight or straight leg raise her right lower extremity from the bed secondary to severe pain. Patient denies any trauma related to current pain symptoms. Patient denies numbness or tingling or changes in sensation to the right lower extremity. Patient denies any past history of lumbar back pain or hip pain. Patient denies fever, chills, headache, vision changes, chest pain, chest tightness, shortness of breath, abdominal pain, nausea and vomiting.  Past Medical History:  Diagnosis Date  . Ovarian cyst     Patient Active Problem List   Diagnosis Date Noted  . Lump or mass in breast 07/14/2013    Past Surgical History:  Procedure Laterality Date  . ABDOMINAL HYSTERECTOMY    . TUBAL LIGATION      Prior to Admission medications   Medication Sig Start Date End Date Taking? Authorizing Provider  cyclobenzaprine (FLEXERIL) 5 MG tablet Take 1 tablet (5 mg total) by mouth 3 (three) times daily as needed. 05/16/17   Henriette Hesser M, PA-C  ibuprofen (ADVIL,MOTRIN) 800 MG tablet Take 1 tablet (800 mg total) by mouth every 8 (eight) hours as needed for moderate pain. 02/13/16   Sable Feil, PA-C  oxyCODONE-acetaminophen (PERCOCET) 5-325 MG tablet Take 2 tablets by mouth every 6 (six) hours as needed for moderate pain or severe pain. 12/26/15   Earleen Newport, MD  oxyCODONE-acetaminophen (ROXICET) 5-325 MG tablet Take 1 tablet by mouth every 6 (six) hours as needed for moderate pain. 02/13/16   Sable Feil, PA-C  predniSONE (STERAPRED  UNI-PAK 21 TAB) 10 MG (21) TBPK tablet Take 6 tablets on day 1. Take 5 tablets on day 2. Take 4 tablets on day 3. Take 3 tablets on day 4. Take 2 tablets on day 5. Take 1 tablets on day 6. 05/16/17   Shandora Koogler M, PA-C  traMADol (ULTRAM) 50 MG tablet Take 1 tablet (50 mg total) by mouth every 6 (six) hours as needed. 05/16/17 05/16/18  Ziyonna Christner, Laroy Apple, PA-C    Allergies Patient has no known allergies.  Family History  Problem Relation Age of Onset  . Breast cancer Mother   . Breast cancer Maternal Grandmother   . Breast cancer Maternal Aunt     Social History Social History  Substance Use Topics  . Smoking status: Current Every Day Smoker    Packs/day: 1.00    Years: 15.00    Types: Cigarettes  . Smokeless tobacco: Never Used  . Alcohol use Yes    Review of Systems Constitutional: Negative for fever/chills Eyes: No visual changes Cardiovascular: Denies chest pain. Respiratory: Denies cough Denies shortness of breath. Gastrointestinal: No abdominal pain.  No nausea, vomiting, diarrhea. Genitourinary: Negative for dysuria. Musculoskeletal: Negative for back pain. Right hip pain.  Skin: Negative for rash. Neurological: Negative for headaches.  Negative focal weakness or numbness. Severe pain in the right hip with ambulation.  ____________________________________________   PHYSICAL EXAM:  VITAL SIGNS: ED Triage Vitals  Enc Vitals Group     BP 05/16/17 2128 130/76     Pulse Rate  05/16/17 2128 83     Resp --      Temp 05/16/17 2128 98.5 F (36.9 C)     Temp Source 05/16/17 2128 Oral     SpO2 05/16/17 2128 100 %     Weight 05/16/17 2128 190 lb (86.2 kg)     Height 05/16/17 2128 5\' 4"  (1.626 m)     Head Circumference --      Peak Flow --      Pain Score 05/16/17 2132 10     Pain Loc --      Pain Edu? --      Excl. in [place]? --     Constitutional: Alert and oriented. Well appearing and in no acute distress.  Head: Normocephalic and atraumatic. Eyes: Conjunctivae are  normal. PERRL.  Cardiovascular: Normal rate, regular rhythm. Normal distal pulses. Respiratory: Normal respiratory effort. Lungs CTAB Gastrointestinal: Soft and nontender. No distention. Musculoskeletal: Right hip pain, increases with ROM.  Increased pain with weight bearing activities. Intact right lower extremity sensation and reflexes. Pain localized to hip joint. Negative popping, clicking or crepitus. Negative radiculopathy.  Neurologic: Normal speech and language. No gross focal neurologic deficits are appreciated. No sensory loss or abnormal reflexes.  Skin:  Skin is warm, dry and intact. No rash noted. Psychiatric: Mood and affect are normal.  ____________________________________________   LABS (all labs ordered are listed, but only abnormal results are displayed)  Labs Reviewed - No data to display ____________________________________________  EKG none ____________________________________________  RADIOLOGY DG right hip  FINDINGS: There is no evidence of fracture or dislocation. Both femoral heads are seated normally within their respective acetabula. The proximal right femur appears intact. No significant degenerative change is appreciated. The sacroiliac joints are unremarkable in appearance.  The visualized bowel gas pattern is grossly unremarkable in appearance.  IMPRESSION: No evidence of fracture or dislocation. ____________________________________________   PROCEDURES  Procedure(s) performed: no    Critical Care performed: no ____________________________________________   INITIAL IMPRESSION / ASSESSMENT AND PLAN / ED COURSE  Pertinent labs & imaging results that were available during my care of the patient were reviewed by me and considered in my medical decision making (see chart for details).  Patient presents with atraumatic right hip pain localized in the groin. Physical exam findings and imaging were reassuring for no acute fracture or  dislocation. Lower extremity ROM, strength and sensation intact however limited by current pain. Negative radiculopathy. Patient will continue prednisone taper and muscle relaxer for symptom management. Recommended patient follow up with orthopedics if symptoms persisted. Patient informed of clinical course, understand medical decision-making process, and agree with plan.  Patient was advised to follow up with Orthopedics and was also advised to return to the emergency department for symptoms that change or worsen.      If controlled substance prescribed during this visit, 12 month history viewed on the [facility] prior to issuing an initial prescription for Schedule II or III opiod. ____________________________________________   FINAL CLINICAL IMPRESSION(S) / ED DIAGNOSES  Final diagnoses:  Acute pain of right hip       NEW MEDICATIONS STARTED DURING THIS VISIT:  Discharge Medication List as of 05/16/2017 11:09 PM    START taking these medications   Details  cyclobenzaprine (FLEXERIL) 5 MG tablet Take 1 tablet (5 mg total) by mouth 3 (three) times daily as needed., Starting Fri 05/16/2017, Print    predniSONE (STERAPRED UNI-PAK 21 TAB) 10 MG (21) TBPK tablet Take 6 tablets on day 1. Take 5 tablets  on day 2. Take 4 tablets on day 3. Take 3 tablets on day 4. Take 2 tablets on day 5. Take 1 tablets on day 6., Print    traMADol (ULTRAM) 50 MG tablet Take 1 tablet (50 mg total) by mouth every 6 (six) hours as needed., Starting Fri 05/16/2017, Until Sat 05/16/2018, Print         Note:  This document was prepared using Dragon voice recognition software and may include unintentional dictation errors.   Jerolyn Shin, PA-C 05/17/17 1624    Nance Pear, MD 05/18/17 [phone]

## 2017-05-16 NOTE — ED Notes (Signed)

## 2018-09-28 ENCOUNTER — Other Ambulatory Visit: Payer: Self-pay

## 2018-09-28 ENCOUNTER — Emergency Department
Admission: EM | Admit: 2018-09-28 | Discharge: 2018-09-29 | Disposition: A | Discharge status: Home / Self Care | Payer: Self-pay | Attending: Emergency Medicine | Admitting: Emergency Medicine

## 2018-09-28 ENCOUNTER — Emergency Department: Payer: Self-pay

## 2018-09-28 ENCOUNTER — Encounter: Payer: Self-pay | Admitting: Intensive Care

## 2018-09-28 DIAGNOSIS — M79606 Pain in leg, unspecified: Secondary | ICD-10-CM

## 2018-09-28 DIAGNOSIS — M79604 Pain in right leg: Secondary | ICD-10-CM | POA: Insufficient documentation

## 2018-09-28 DIAGNOSIS — F17290 Nicotine dependence, other tobacco product, uncomplicated: Secondary | ICD-10-CM | POA: Insufficient documentation

## 2018-09-28 DIAGNOSIS — N839 Noninflammatory disorder of ovary, fallopian tube and broad ligament, unspecified: Secondary | ICD-10-CM | POA: Insufficient documentation

## 2018-09-28 DIAGNOSIS — Z853 Personal history of malignant neoplasm of breast: Secondary | ICD-10-CM | POA: Insufficient documentation

## 2018-09-28 HISTORY — DX: Malignant neoplasm of unspecified site of unspecified female breast: C50.919

## 2018-09-28 LAB — CBC WITH DIFFERENTIAL/PLATELET
Abs Immature Granulocytes: 0.06 10*3/uL (ref 0.00–0.07)
BASOS PCT: 1 %
Basophils Absolute: 0 10*3/uL (ref 0.0–0.1)
Eosinophils Absolute: 0.1 10*3/uL (ref 0.0–0.5)
Eosinophils Relative: 1 %
HCT: 42.9 % (ref 36.0–46.0)
HEMOGLOBIN: 14 g/dL (ref 12.0–15.0)
IMMATURE GRANULOCYTES: 1 %
Lymphocytes Relative: 18 %
Lymphs Abs: 1.6 10*3/uL (ref 0.7–4.0)
MCH: 28.5 pg (ref 26.0–34.0)
MCHC: 32.6 g/dL (ref 30.0–36.0)
MCV: 87.2 fL (ref 80.0–100.0)
MONO ABS: 0.8 10*3/uL (ref 0.1–1.0)
MONOS PCT: 9 %
NEUTROS ABS: 6.3 10*3/uL (ref 1.7–7.7)
NEUTROS PCT: 70 %
PLATELETS: 257 10*3/uL (ref 150–400)
RBC: 4.92 MIL/uL (ref 3.87–5.11)
RDW: 13.9 % (ref 11.5–15.5)
WBC: 8.9 10*3/uL (ref 4.0–10.5)
nRBC: 0 % (ref 0.0–0.2)

## 2018-09-28 LAB — BASIC METABOLIC PANEL
ANION GAP: 8 (ref 5–15)
BUN: 14 mg/dL (ref 6–20)
CHLORIDE: 104 mmol/L (ref 98–111)
CO2: 26 mmol/L (ref 22–32)
Calcium: 9.3 mg/dL (ref 8.9–10.3)
Creatinine, Ser: 0.97 mg/dL (ref 0.44–1.00)
GFR calc Af Amer: >60 mL/min (ref >60)
GLUCOSE: 80 mg/dL (ref 70–99)
POTASSIUM: 4.3 mmol/L (ref 3.5–5.1)
Sodium: 138 mmol/L (ref 135–145)

## 2018-09-28 LAB — HCG, QUANTITATIVE, PREGNANCY: hCG, Beta Chain, Quant, S: 1 m[IU]/mL (ref <5)

## 2018-09-28 MED ORDER — IOPAMIDOL (ISOVUE-370) INJECTION 76%
100.0000 mL | Freq: Once | INTRAVENOUS | Status: AC | PRN
  Administered 2018-09-28: 100 mL via INTRAVENOUS

## 2018-09-28 MED ORDER — OXYCODONE-ACETAMINOPHEN 5-325 MG PO TABS
1.0000 | ORAL_TABLET | Freq: Once | ORAL | Status: AC
  Administered 2018-09-28: 1 via ORAL
  Filled 2018-09-28: qty 1

## 2018-09-28 NOTE — ED Provider Notes (Signed)
[facility] Emergency Department Provider Note  ____________________________________________  Time seen: Approximately 9:39 PM  I have reviewed the triage vital signs and the nursing notes.   HISTORY  Chief Complaint No chief complaint on file.   HPI Caitlin Holt is a 47 y.o. female with h/o recently diagnosed L sided breast cancer with metastasis to the lymph node status post mastectomy and lymphadenectomy 13 days ago presents for evaluation of right leg pain.  Patient reports 1 week of intermittent pain and numbness of her right leg.  She reports that her symptoms start at the proximal thigh extend to the knee.  The symptoms were initially intermittent but now they have been constant since this morning.  Her pain is an achy pain, moderate, constant. She is also complaining of paresthesias on that leg and mild weakness since this am.  Patient is not currently undergoing any treatment for her cancer.  She denies back pain, saddle anesthesia, fall.  Patient has no headache, no facial droop, no slurred speech.   Past Medical History:  Diagnosis Date  . Breast cancer ([place])   . Ovarian cyst     Patient Active Problem List   Diagnosis Date Noted  . Lump or mass in breast 07/14/2013    Past Surgical History:  Procedure Laterality Date  . ABDOMINAL HYSTERECTOMY    . TUBAL LIGATION      Prior to Admission medications   Medication Sig Start Date End Date Taking? Authorizing Provider  cyclobenzaprine (FLEXERIL) 5 MG tablet Take 1 tablet (5 mg total) by mouth 3 (three) times daily as needed. 05/16/17   Little, Traci M, PA-C  ibuprofen (ADVIL,MOTRIN) 800 MG tablet Take 1 tablet (800 mg total) by mouth every 8 (eight) hours as needed for moderate pain. 02/13/16   Sable Feil, PA-C  oxyCODONE-acetaminophen (PERCOCET) 5-325 MG tablet Take 2 tablets by mouth every 6 (six) hours as needed for moderate pain or severe pain. 12/26/15   Earleen Newport, MD    oxyCODONE-acetaminophen (ROXICET) 5-325 MG tablet Take 1 tablet by mouth every 6 (six) hours as needed for moderate pain. 02/13/16   Sable Feil, PA-C  predniSONE (STERAPRED UNI-PAK 21 TAB) 10 MG (21) TBPK tablet Take 6 tablets on day 1. Take 5 tablets on day 2. Take 4 tablets on day 3. Take 3 tablets on day 4. Take 2 tablets on day 5. Take 1 tablets on day 6. 05/16/17   Little, Traci M, PA-C    Allergies Patient has no known allergies.  Family History  Problem Relation Age of Onset  . Breast cancer Mother   . Breast cancer Maternal Grandmother   . Breast cancer Maternal Aunt     Social History Social History   Tobacco Use  . Smoking status: Current Every Day Smoker    Packs/day: 1.00    Years: 15.00    Pack years: 15.00    Types: E-cigarettes  . Smokeless tobacco: Never Used  Substance Use Topics  . Alcohol use: Yes    Comment: occ  . Drug use: No    Review of Systems  Constitutional: Negative for fever. Eyes: Negative for visual changes. ENT: Negative for sore throat. Neck: No neck pain  Cardiovascular: Negative for chest pain. Respiratory: Negative for shortness of breath. Gastrointestinal: Negative for abdominal pain, vomiting or diarrhea. Genitourinary: Negative for dysuria. Musculoskeletal: Negative for back pain. + RLE pain and numbness Skin: Negative for rash. Neurological: Negative for headaches, weakness or numbness.  Psych: No SI or HI  ____________________________________________   PHYSICAL EXAM:  VITAL SIGNS: ED Triage Vitals [09/28/18 1600]  Enc Vitals Group     BP 136/80     Pulse Rate 77     Resp 16     Temp 98.4 F (36.9 C)     Temp Source Oral     SpO2 97 %     Weight 210 lb (95.3 kg)     Height 5\' 4"  (1.626 m)     Head Circumference      Peak Flow      Pain Score 10     Pain Loc      Pain Edu?      Excl. in [place]?     Constitutional: Alert and oriented. Well appearing and in no apparent distress. HEENT:      Head:  Normocephalic and atraumatic.         Eyes: Conjunctivae are normal. Sclera is non-icteric.       Mouth/Throat: Mucous membranes are moist.       Neck: Supple with no signs of meningismus. Cardiovascular: Regular rate and rhythm. No murmurs, gallops, or rubs. 2+ symmetrical distal pulses are present in all extremities. No JVD. Respiratory: Normal respiratory effort. Lungs are clear to auscultation bilaterally. No wheezes, crackles, or rhonchi.  Gastrointestinal: Soft, non tender, and non distended with positive bowel sounds. No rebound or guarding. Musculoskeletal: Nontender with normal range of motion in all extremities. No edema, cyanosis, or erythema of extremities.  No tenderness palpation of the bones, no midline CT and L-spine tenderness Neurologic: Normal speech and language. Face is symmetric.  Strength and sensation is normal in both lower extremities, patient has 2+ DTRs on bilateral patella Skin: Skin is warm, dry and intact. No rash noted. Psychiatric: Mood and affect are normal. Speech and behavior are normal.  ____________________________________________   LABS (all labs ordered are listed, but only abnormal results are displayed)  Labs Reviewed  HCG, QUANTITATIVE, PREGNANCY  CBC WITH DIFFERENTIAL/PLATELET  BASIC METABOLIC PANEL   ____________________________________________  EKG  none  ____________________________________________  RADIOLOGY  I have personally reviewed the images performed during this visit and I agree with the Radiologist's read.   Interpretation by Radiologist:  US Venous Img Lower Unilateral Right  Result Date: 09/28/2018 CLINICAL DATA:  48 year old female with right lower extremity pain and numbness for the past week. EXAM: RIGHT LOWER EXTREMITY VENOUS DOPPLER ULTRASOUND TECHNIQUE: Gray-scale sonography with graded compression, as well as color Doppler and duplex ultrasound were performed to evaluate the lower extremity deep venous systems from  the level of the common femoral vein and including the common femoral, femoral, profunda femoral, popliteal and calf veins including the posterior tibial, peroneal and gastrocnemius veins when visible. The superficial great saphenous vein was also interrogated. Spectral Doppler was utilized to evaluate flow at rest and with distal augmentation maneuvers in the common femoral, femoral and popliteal veins. COMPARISON:  None. FINDINGS: Contralateral Common Femoral Vein: Respiratory phasicity is normal and symmetric with the symptomatic side. No evidence of thrombus. Normal compressibility. Common Femoral Vein: No evidence of thrombus. Normal compressibility, respiratory phasicity and response to augmentation. Saphenofemoral Junction: No evidence of thrombus. Normal compressibility and flow on color Doppler imaging. Profunda Femoral Vein: No evidence of thrombus. Normal compressibility and flow on color Doppler imaging. Femoral Vein: No evidence of thrombus. Normal compressibility, respiratory phasicity and response to augmentation. Popliteal Vein: No evidence of thrombus. Normal compressibility, respiratory phasicity and response to augmentation. Calf Veins:  No evidence of thrombus. Normal compressibility and flow on color Doppler imaging. Superficial Great Saphenous Vein: No evidence of thrombus. Normal compressibility. Venous Reflux:  None. Other Findings:  None. IMPRESSION: No evidence of deep venous thrombosis. Electronically Signed   By: Jacqulynn Cadet M.D.   On: 09/28/2018 17:06     ____________________________________________   PROCEDURES  Procedure(s) performed: None Procedures Critical Care performed:  None ____________________________________________   INITIAL IMPRESSION / ASSESSMENT AND PLAN / ED COURSE  47 y.o. female with h/o recently diagnosed L sided breast cancer with metastasis to the lymph node status post mastectomy and lymphadenectomy 13 days ago presents for evaluation of right  leg pain x 1 week.  Exam is very benign with no reproducible tenderness, deformities, no signs of cellulitis, no ischemia, no swelling, no pitting edema.  Doppler negative for DVT.  Neurovascular exam is also intact.  Patient has no midline CT and L-spine tenderness but obviously with a history of cancer metastases to the bone causing peripheral nerve dysfunction is a possibility. There is no signs of cauda equina. Dissection also possible although patient does have completely neurovascular exam is intact and she has normal blood pressure, will pursue a CT angiogram.    _________________________ 11:11 PM on 09/28/2018 -----------------------------------------  CT Angio with no evidence of dissection.  MRI of lumbar spine is pending to rule out metastatic disease.  Care transferred to Dr. Mable Paris.   As part of my medical decision making, I reviewed the following data within the [facility] notes reviewed and incorporated, Labs reviewed , Old chart reviewed, Radiograph reviewed , Notes from prior ED visits and [place] Controlled Substance Database    Pertinent labs & imaging results that were available during my care of the patient were reviewed by me and considered in my medical decision making (see chart for details).    ____________________________________________   FINAL CLINICAL IMPRESSION(S) / ED DIAGNOSES  Final diagnoses:  Leg pain      NEW MEDICATIONS STARTED DURING THIS VISIT:  ED Discharge Orders    None       Note:  This document was prepared using Dragon voice recognition software and may include unintentional dictation errors.    Alfred Levins, [place], MD 09/28/18 [phone]

## 2018-09-28 NOTE — ED Triage Notes (Signed)
Patient C/O R leg numbness and achy pain in thigh to buttocks X 1 week and keeps her up at night.  Patient just had partial mastectomy and lymph node removal of L side on 15th. Denies CP or SOB.

## 2018-09-28 NOTE — ED Notes (Signed)
Pt in hallway bed, alert.

## 2018-09-28 NOTE — ED Notes (Signed)
Pt waiting for ct scan.  Pt in hallway bed  Pt alert

## 2018-09-28 NOTE — ED Notes (Signed)
Patient transported to CT 

## 2018-09-28 NOTE — ED Notes (Signed)
Patient transported to X-ray 

## 2018-09-28 NOTE — ED Notes (Addendum)
Pt has pain and tingling in right upper leg.  Pt states leg feels hot and tingling.  No known injury to leg.  Pt is alert.  Speech clear.   Pt dx with breast cancer 8/19   Treated at [facility].  Pt has a drain in left breast/axilla.    Pt in hallway bed.

## 2018-09-29 MED ORDER — GADOBUTROL 1 MMOL/ML IV SOLN
9.0000 mL | Freq: Once | INTRAVENOUS | Status: AC | PRN
  Administered 2018-09-29: 9 mL via INTRAVENOUS

## 2018-09-29 NOTE — Discharge Instructions (Signed)
Results for orders placed or performed during the hospital encounter of 09/28/18  hCG, quantitative, pregnancy  Result Value Ref Range   hCG, Beta Chain, Quant, S 1 <5 mIU/mL  CBC with Differential/Platelet  Result Value Ref Range   WBC 8.9 4.0 - 10.5 K/uL   RBC 4.92 3.87 - 5.11 MIL/uL   Hemoglobin 14.0 12.0 - 15.0 g/dL   HCT 42.9 36.0 - 46.0 %   MCV 87.2 80.0 - 100.0 fL   MCH 28.5 26.0 - 34.0 pg   MCHC 32.6 30.0 - 36.0 g/dL   RDW 13.9 11.5 - 15.5 %   Platelets 257 150 - 400 K/uL   nRBC 0.0 0.0 - 0.2 %   Neutrophils Relative % 70 %   Neutro Abs 6.3 1.7 - 7.7 K/uL   Lymphocytes Relative 18 %   Lymphs Abs 1.6 0.7 - 4.0 K/uL   Monocytes Relative 9 %   Monocytes Absolute 0.8 0.1 - 1.0 K/uL   Eosinophils Relative 1 %   Eosinophils Absolute 0.1 0.0 - 0.5 K/uL   Basophils Relative 1 %   Basophils Absolute 0.0 0.0 - 0.1 K/uL   Immature Granulocytes 1 %   Abs Immature Granulocytes 0.06 0.00 - 0.07 K/uL  Basic metabolic panel  Result Value Ref Range   Sodium 138 135 - 145 mmol/L   Potassium 4.3 3.5 - 5.1 mmol/L   Chloride 104 98 - 111 mmol/L   CO2 26 22 - 32 mmol/L   Glucose, Bld 80 70 - 99 mg/dL   BUN 14 6 - 20 mg/dL   Creatinine, Ser 0.97 0.44 - 1.00 mg/dL   Calcium 9.3 8.9 - 10.3 mg/dL   GFR calc non Af Amer >60 >60 mL/min   GFR calc Af Amer >60 >60 mL/min   Anion gap 8 5 - 15   Mr Lumbar Spine W Wo Contrast  Result Date: 09/29/2018 CLINICAL DATA:  47 y/o F; breast cancer with metastasis to lymph nodes postmastectomy and lymphadenectomy. One week of intermittent pain and numbness of the right lower extremity. EXAM: MRI LUMBAR SPINE WITHOUT AND WITH CONTRAST TECHNIQUE: Multiplanar and multiecho pulse sequences of the lumbar spine were obtained without and with intravenous contrast. CONTRAST:  9 cc Gadavist. COMPARISON:  None. FINDINGS: Segmentation:  Standard. Alignment:  Physiologic. Vertebrae:  No fracture, evidence of discitis, or bone lesion. Conus medullaris and cauda  equina: Conus extends to the L1-2 level. Conus and cauda equina appear normal. Paraspinal and other soft tissues: 9 mm right kidney interpolar cyst. Disc levels: L1-2: No significant disc displacement, foraminal stenosis, or canal stenosis. L2-3: No significant disc displacement, foraminal stenosis, or canal stenosis. L3-4: Mild disc desiccation with loss of disc space height and small disc bulge. Left subarticular annular fissure. No significant foraminal or canal stenosis. L4-5: Mild disc desiccation with loss of disc space height and small disc bulge. Left subarticular annular fissure. Trace degenerative facet effusions. No significant foraminal or canal stenosis. L5-S1: Small central protrusion with slight ventral thecal sac effacement. No significant foraminal or canal stenosis. IMPRESSION: 1. No acute fracture or malalignment.  No abnormal enhancement. 2. Mild discogenic degenerative changes of the lumbar spine at L3-4, L4-5, and L5-S1. No significant foraminal or canal stenosis. Electronically Signed   By: Kristine Garbe M.D.   On: 09/29/2018 01:17   US Venous Img Lower Unilateral Right  Result Date: 09/28/2018 CLINICAL DATA:  47 year old female with right lower extremity pain and numbness for the past week. EXAM: RIGHT LOWER EXTREMITY  VENOUS DOPPLER ULTRASOUND TECHNIQUE: Gray-scale sonography with graded compression, as well as color Doppler and duplex ultrasound were performed to evaluate the lower extremity deep venous systems from the level of the common femoral vein and including the common femoral, femoral, profunda femoral, popliteal and calf veins including the posterior tibial, peroneal and gastrocnemius veins when visible. The superficial great saphenous vein was also interrogated. Spectral Doppler was utilized to evaluate flow at rest and with distal augmentation maneuvers in the common femoral, femoral and popliteal veins. COMPARISON:  None. FINDINGS: Contralateral Common Femoral  Vein: Respiratory phasicity is normal and symmetric with the symptomatic side. No evidence of thrombus. Normal compressibility. Common Femoral Vein: No evidence of thrombus. Normal compressibility, respiratory phasicity and response to augmentation. Saphenofemoral Junction: No evidence of thrombus. Normal compressibility and flow on color Doppler imaging. Profunda Femoral Vein: No evidence of thrombus. Normal compressibility and flow on color Doppler imaging. Femoral Vein: No evidence of thrombus. Normal compressibility, respiratory phasicity and response to augmentation. Popliteal Vein: No evidence of thrombus. Normal compressibility, respiratory phasicity and response to augmentation. Calf Veins: No evidence of thrombus. Normal compressibility and flow on color Doppler imaging. Superficial Great Saphenous Vein: No evidence of thrombus. Normal compressibility. Venous Reflux:  None. Other Findings:  None. IMPRESSION: No evidence of deep venous thrombosis. Electronically Signed   By: Jacqulynn Cadet M.D.   On: 09/28/2018 17:06   Dg Femur, Min 2 Views Right  Result Date: 09/29/2018 CLINICAL DATA:  47 year old female with numbness of the right lower extremity. EXAM: RIGHT FEMUR 2 VIEWS COMPARISON:  None. FINDINGS: There is no evidence of fracture or other focal bone lesions. Soft tissues are unremarkable. IMPRESSION: Negative. Electronically Signed   By: Anner Crete M.D.   On: 09/29/2018 01:14   Ct Angio Abd/pel W And/or Wo Contrast  Result Date: 09/28/2018 CLINICAL DATA:  47 year old female with right leg swelling. Concern for DVT. EXAM: CTA ABDOMEN AND PELVIS wITHOUT AND WITH CONTRAST TECHNIQUE: Multidetector CT imaging of the abdomen and pelvis was performed using the standard protocol during bolus administration of intravenous contrast. Multiplanar reconstructed images and MIPs were obtained and reviewed to evaluate the vascular anatomy. CONTRAST:  169mL ISOVUE-370 IOPAMIDOL (ISOVUE-370) INJECTION  76% COMPARISON:  None. FINDINGS: VASCULAR Aorta: Normal caliber aorta without aneurysm, dissection, vasculitis or significant stenosis. Minimal atherosclerotic disease. Celiac: Patent without evidence of aneurysm, dissection, vasculitis or significant stenosis. SMA: Patent without evidence of aneurysm, dissection, vasculitis or significant stenosis. Renals: Both renal arteries are patent without evidence of aneurysm, dissection, vasculitis, fibromuscular dysplasia or significant stenosis. Duplicated renal artery anatomy bilaterally. IMA: Patent without evidence of aneurysm, dissection, vasculitis or significant stenosis. Inflow: Patent without evidence of aneurysm, dissection, vasculitis or significant stenosis. Proximal Outflow: Bilateral common femoral and visualized portions of the superficial and profunda femoral arteries are patent without evidence of aneurysm, dissection, vasculitis or significant stenosis. Veins: The SMV, splenic vein, and main portal vein are patent. No portal venous gas. The visualized common femoral veins, iliac veins, and IVC appear unremarkable for the degree of opacification. Review of the MIP images confirms the above findings. NON-VASCULAR No intra-abdominal free air or free fluid. Lower chest: No acute abnormality. Hepatobiliary: No focal liver abnormality is seen. No gallstones, gallbladder wall thickening, or biliary dilatation. Pancreas: Unremarkable. No pancreatic ductal dilatation or surrounding inflammatory changes. Spleen: Normal in size without focal abnormality. Adrenals/Urinary Tract: The adrenal glands are unremarkable. Subcentimeter right renal upper pole hypodense lesion is too small to characterize. The kidneys, visualized ureters, and urinary  bladder appear unremarkable. Fifty Stomach/Bowel: Stomach is within normal limits. Appendix appears normal. No evidence of bowel wall thickening, distention, or inflammatory changes. Lymphatic: No adenopathy. Reproductive:  Hysterectomy. A 3 cm probable left ovarian corpus luteum or complex cyst. Ultrasound may provide better characterisation. The right ovary is grossly unremarkable. Other: None Musculoskeletal: No acute or significant osseous findings. IMPRESSION: VASCULAR No acute findings. No evidence of aortic dissection or aneurysm. Unremarkable major venous system for the degree of opacification. NON-VASCULAR 1. No acute intra-abdominal or pelvic pathology. 2. Probable left ovarian corpus luteum or complex cyst. Ultrasound may provide better characterisation. Electronically Signed   By: Anner Crete M.D.   On: 09/28/2018 23:55

## 2018-09-29 NOTE — ED Notes (Signed)
Patient transported to MRI 

## 2018-09-29 NOTE — ED Provider Notes (Signed)
MRI negative.  Karn Cassis, MD 09/29/18 [phone]

## 2018-10-27 ENCOUNTER — Observation Stay
Admission: EM | Admit: 2018-10-27 | Discharge: 2018-10-28 | Disposition: A | Discharge status: Home / Self Care | Payer: Self-pay | Attending: Internal Medicine | Admitting: Internal Medicine

## 2018-10-27 ENCOUNTER — Emergency Department: Payer: Self-pay

## 2018-10-27 ENCOUNTER — Other Ambulatory Visit: Payer: Self-pay

## 2018-10-27 ENCOUNTER — Encounter: Payer: Self-pay | Admitting: *Deleted

## 2018-10-27 DIAGNOSIS — C50919 Malignant neoplasm of unspecified site of unspecified female breast: Secondary | ICD-10-CM | POA: Insufficient documentation

## 2018-10-27 DIAGNOSIS — R079 Chest pain, unspecified: Secondary | ICD-10-CM | POA: Diagnosis present

## 2018-10-27 DIAGNOSIS — E860 Dehydration: Secondary | ICD-10-CM | POA: Insufficient documentation

## 2018-10-27 DIAGNOSIS — E669 Obesity, unspecified: Secondary | ICD-10-CM | POA: Insufficient documentation

## 2018-10-27 DIAGNOSIS — Z79899 Other long term (current) drug therapy: Secondary | ICD-10-CM | POA: Insufficient documentation

## 2018-10-27 DIAGNOSIS — F17210 Nicotine dependence, cigarettes, uncomplicated: Secondary | ICD-10-CM | POA: Insufficient documentation

## 2018-10-27 DIAGNOSIS — R000 Tachycardia, unspecified: Secondary | ICD-10-CM | POA: Insufficient documentation

## 2018-10-27 DIAGNOSIS — Z791 Long term (current) use of non-steroidal anti-inflammatories (NSAID): Secondary | ICD-10-CM | POA: Insufficient documentation

## 2018-10-27 DIAGNOSIS — Z9221 Personal history of antineoplastic chemotherapy: Secondary | ICD-10-CM | POA: Insufficient documentation

## 2018-10-27 DIAGNOSIS — R072 Precordial pain: Principal | ICD-10-CM | POA: Insufficient documentation

## 2018-10-27 DIAGNOSIS — Z803 Family history of malignant neoplasm of breast: Secondary | ICD-10-CM | POA: Insufficient documentation

## 2018-10-27 DIAGNOSIS — D72819 Decreased white blood cell count, unspecified: Secondary | ICD-10-CM | POA: Insufficient documentation

## 2018-10-27 DIAGNOSIS — Z885 Allergy status to narcotic agent status: Secondary | ICD-10-CM | POA: Insufficient documentation

## 2018-10-27 DIAGNOSIS — R0602 Shortness of breath: Secondary | ICD-10-CM

## 2018-10-27 DIAGNOSIS — R739 Hyperglycemia, unspecified: Secondary | ICD-10-CM | POA: Insufficient documentation

## 2018-10-27 LAB — COMPREHENSIVE METABOLIC PANEL
ALK PHOS: 70 U/L (ref 38–126)
ALT: 31 U/L (ref 0–44)
ANION GAP: 10 (ref 5–15)
AST: 29 U/L (ref 15–41)
Albumin: 3.9 g/dL (ref 3.5–5.0)
BUN: 15 mg/dL (ref 6–20)
CALCIUM: 9.1 mg/dL (ref 8.9–10.3)
CO2: 25 mmol/L (ref 22–32)
CREATININE: 1.08 mg/dL — AB (ref 0.44–1.00)
Chloride: 104 mmol/L (ref 98–111)
GFR calc non Af Amer: 59 mL/min — ABNORMAL LOW (ref >60)
GLUCOSE: 111 mg/dL — AB (ref 70–99)
Potassium: 3.7 mmol/L (ref 3.5–5.1)
Sodium: 139 mmol/L (ref 135–145)
TOTAL PROTEIN: 7 g/dL (ref 6.5–8.1)
Total Bilirubin: 0.8 mg/dL (ref 0.3–1.2)

## 2018-10-27 LAB — CBC WITH DIFFERENTIAL/PLATELET
Abs Immature Granulocytes: 0.15 10*3/uL — ABNORMAL HIGH (ref 0.00–0.07)
BASOS ABS: 0 10*3/uL (ref 0.0–0.1)
BASOS PCT: 2 %
EOS ABS: 0.1 10*3/uL (ref 0.0–0.5)
Eosinophils Relative: 2 %
HCT: 41.8 % (ref 36.0–46.0)
Hemoglobin: 14 g/dL (ref 12.0–15.0)
Immature Granulocytes: 6 %
Lymphocytes Relative: 39 %
Lymphs Abs: 0.9 10*3/uL (ref 0.7–4.0)
MCH: 28.7 pg (ref 26.0–34.0)
MCHC: 33.5 g/dL (ref 30.0–36.0)
MCV: 85.7 fL (ref 80.0–100.0)
MONOS PCT: 14 %
Monocytes Absolute: 0.3 10*3/uL (ref 0.1–1.0)
NEUTROS PCT: 37 %
NRBC: 0 % (ref 0.0–0.2)
Neutro Abs: 0.9 10*3/uL — ABNORMAL LOW (ref 1.7–7.7)
Platelets: 157 10*3/uL (ref 150–400)
RBC: 4.88 MIL/uL (ref 3.87–5.11)
RDW: 13.2 % (ref 11.5–15.5)
WBC: 2.4 10*3/uL — AB (ref 4.0–10.5)

## 2018-10-27 LAB — PHOSPHORUS: PHOSPHORUS: 2.7 mg/dL (ref 2.5–4.6)

## 2018-10-27 LAB — TROPONIN I
Troponin I: <0.03 ng/mL (ref <0.03)
Troponin I: <0.03 ng/mL (ref <0.03)

## 2018-10-27 LAB — I-STAT BETA HCG BLOOD, ED (NOT ORDERABLE): I-stat hCG, quantitative: <5 m[IU]/mL (ref <5)

## 2018-10-27 LAB — MAGNESIUM: Magnesium: 1.8 mg/dL (ref 1.7–2.4)

## 2018-10-27 LAB — PATHOLOGIST SMEAR REVIEW

## 2018-10-27 MED ORDER — BISACODYL 5 MG PO TBEC: 5.0000 mg | DELAYED_RELEASE_TABLET | Freq: Every day | ORAL | Status: DC | PRN

## 2018-10-27 MED ORDER — ENOXAPARIN SODIUM 40 MG/0.4ML ~~LOC~~ SOLN
40.0000 mg | SUBCUTANEOUS | Status: DC
  Administered 2018-10-27: 40 mg via SUBCUTANEOUS
  Filled 2018-10-27: qty 0.4

## 2018-10-27 MED ORDER — ONDANSETRON HCL 4 MG/2ML IJ SOLN
4.0000 mg | Freq: Once | INTRAMUSCULAR | Status: AC
  Administered 2018-10-27: 4 mg via INTRAVENOUS
  Filled 2018-10-27: qty 2

## 2018-10-27 MED ORDER — ACETAMINOPHEN 325 MG PO TABS
650.0000 mg | ORAL_TABLET | ORAL | Status: DC | PRN
  Administered 2018-10-28 (×2): 650 mg via ORAL
  Filled 2018-10-27 (×2): qty 2

## 2018-10-27 MED ORDER — SENNOSIDES-DOCUSATE SODIUM 8.6-50 MG PO TABS: 1.0000 | ORAL_TABLET | Freq: Every evening | ORAL | Status: DC | PRN

## 2018-10-27 MED ORDER — LACTATED RINGERS IV SOLN
INTRAVENOUS | Status: DC
  Administered 2018-10-27 – 2018-10-28 (×3): via INTRAVENOUS

## 2018-10-27 MED ORDER — MORPHINE SULFATE (PF) 2 MG/ML IV SOLN
2.0000 mg | INTRAVENOUS | Status: DC | PRN
  Administered 2018-10-27: 2 mg via INTRAVENOUS
  Filled 2018-10-27: qty 1

## 2018-10-27 MED ORDER — MORPHINE SULFATE (PF) 4 MG/ML IV SOLN
4.0000 mg | INTRAVENOUS | Status: DC | PRN
  Administered 2018-10-27: 4 mg via INTRAVENOUS
  Filled 2018-10-27: qty 1

## 2018-10-27 MED ORDER — NITROGLYCERIN 0.4 MG SL SUBL: 0.4000 mg | SUBLINGUAL_TABLET | SUBLINGUAL | Status: DC | PRN

## 2018-10-27 MED ORDER — SODIUM CHLORIDE 0.9 % IV BOLUS
1000.0000 mL | Freq: Once | INTRAVENOUS | Status: AC
  Administered 2018-10-27: 1000 mL via INTRAVENOUS

## 2018-10-27 MED ORDER — IPRATROPIUM-ALBUTEROL 0.5-2.5 (3) MG/3ML IN SOLN
3.0000 mL | Freq: Once | RESPIRATORY_TRACT | Status: AC
  Administered 2018-10-27: 3 mL via RESPIRATORY_TRACT
  Filled 2018-10-27: qty 3

## 2018-10-27 MED ORDER — ONDANSETRON HCL 4 MG/2ML IJ SOLN
INTRAMUSCULAR | Status: AC
  Filled 2018-10-27: qty 2

## 2018-10-27 MED ORDER — ONDANSETRON HCL 4 MG/2ML IJ SOLN
4.0000 mg | Freq: Four times a day (QID) | INTRAMUSCULAR | Status: DC | PRN
  Administered 2018-10-27 – 2018-10-28 (×3): 4 mg via INTRAVENOUS
  Filled 2018-10-27 (×2): qty 2

## 2018-10-27 MED ORDER — KETOROLAC TROMETHAMINE 30 MG/ML IJ SOLN
15.0000 mg | Freq: Once | INTRAMUSCULAR | Status: AC
  Administered 2018-10-27: 15 mg via INTRAVENOUS
  Filled 2018-10-27: qty 1

## 2018-10-27 MED ORDER — GUAIFENESIN-DM 100-10 MG/5ML PO SYRP
5.0000 mL | ORAL_SOLUTION | ORAL | Status: DC | PRN
  Administered 2018-10-27: 5 mL via ORAL
  Filled 2018-10-27: qty 5

## 2018-10-27 MED ORDER — SODIUM CHLORIDE 0.9 % IV SOLN
Freq: Once | INTRAVENOUS | Status: AC
  Administered 2018-10-27: 06:00:00 via INTRAVENOUS

## 2018-10-27 MED ORDER — MORPHINE SULFATE (PF) 4 MG/ML IV SOLN
4.0000 mg | INTRAVENOUS | Status: DC | PRN
  Administered 2018-10-27: 4 mg via INTRAVENOUS
  Filled 2018-10-27 (×3): qty 1

## 2018-10-27 MED ORDER — IOHEXOL 350 MG/ML SOLN
75.0000 mL | Freq: Once | INTRAVENOUS | Status: AC | PRN
  Administered 2018-10-27: 75 mL via INTRAVENOUS

## 2018-10-27 MED ORDER — PREDNISONE 20 MG PO TABS: 60.0000 mg | ORAL_TABLET | Freq: Once | ORAL | Status: DC

## 2018-10-27 MED ORDER — IPRATROPIUM-ALBUTEROL 0.5-2.5 (3) MG/3ML IN SOLN: 3.0000 mL | RESPIRATORY_TRACT | Status: DC | PRN

## 2018-10-27 MED ORDER — PROCHLORPERAZINE EDISYLATE 10 MG/2ML IJ SOLN
10.0000 mg | Freq: Four times a day (QID) | INTRAMUSCULAR | Status: DC | PRN
  Administered 2018-10-27 – 2018-10-28 (×2): 10 mg via INTRAVENOUS
  Filled 2018-10-27 (×3): qty 2

## 2018-10-27 MED ORDER — CYCLOBENZAPRINE HCL 10 MG PO TABS: 5.0000 mg | ORAL_TABLET | Freq: Three times a day (TID) | ORAL | Status: DC | PRN

## 2018-10-27 MED ORDER — ASPIRIN 81 MG PO CHEW
81.0000 mg | CHEWABLE_TABLET | Freq: Every day | ORAL | Status: DC
  Administered 2018-10-27 – 2018-10-28 (×2): 81 mg via ORAL
  Filled 2018-10-27 (×2): qty 1

## 2018-10-27 NOTE — ED Notes (Signed)
Pt. Attempted to get up to use the bathroom and walk around.  She states she is very dizzy and weak feeling when standing.  Patient states she still feels she is dehydrated and would like to stay for observation as Dr. Quentin Cornwall mentioned.  Notified Dr. Quentin Cornwall.

## 2018-10-27 NOTE — ED Provider Notes (Signed)
[facility] Emergency Department Provider Note    First MD Initiated Contact with Patient 10/27/18 [phone]     (approximate)  I have reviewed the triage vital signs and the nursing notes.   HISTORY  Chief Complaint Chest Pain    HPI Caitlin Holt is a 47 y.o. female is completing her first dose of chemotherapy this past week for breast cancer presents the ER for chief complaint of midsternal chest pain shortness of breath low-grade temperature.  Is also had mild headache.  Denies any numbness or tingling.  No neck stiffness.  Denies any sick contacts.  Has not taken anything for pain.  Patient is tearful in the room.  She does endorse using vape device but does not use any THC or CBD substances.    Past Medical History:  Diagnosis Date  . Breast cancer ([place])   . Ovarian cyst    Family History  Problem Relation Age of Onset  . Breast cancer Mother   . Breast cancer Maternal Grandmother   . Breast cancer Maternal Aunt    Past Surgical History:  Procedure Laterality Date  . ABDOMINAL HYSTERECTOMY    . TUBAL LIGATION     Patient Active Problem List   Diagnosis Date Noted  . Lump or mass in breast 07/14/2013      Prior to Admission medications   Medication Sig Start Date End Date Taking? Authorizing Provider  cyclobenzaprine (FLEXERIL) 5 MG tablet Take 1 tablet (5 mg total) by mouth 3 (three) times daily as needed. 05/16/17   Little, Traci M, PA-C  ibuprofen (ADVIL,MOTRIN) 800 MG tablet Take 1 tablet (800 mg total) by mouth every 8 (eight) hours as needed for moderate pain. 02/13/16   Sable Feil, PA-C  oxyCODONE-acetaminophen (PERCOCET) 5-325 MG tablet Take 2 tablets by mouth every 6 (six) hours as needed for moderate pain or severe pain. 12/26/15   Earleen Newport, MD  oxyCODONE-acetaminophen (ROXICET) 5-325 MG tablet Take 1 tablet by mouth every 6 (six) hours as needed for moderate pain. 02/13/16   Sable Feil, PA-C  predniSONE  (STERAPRED UNI-PAK 21 TAB) 10 MG (21) TBPK tablet Take 6 tablets on day 1. Take 5 tablets on day 2. Take 4 tablets on day 3. Take 3 tablets on day 4. Take 2 tablets on day 5. Take 1 tablets on day 6. 05/16/17   Little, Traci M, PA-C    Allergies Codeine and Morphine    Social History Social History   Tobacco Use  . Smoking status: Current Every Day Smoker    Packs/day: 1.00    Years: 15.00    Pack years: 15.00    Types: E-cigarettes  . Smokeless tobacco: Never Used  Substance Use Topics  . Alcohol use: Yes    Comment: occ  . Drug use: No    Review of Systems Patient denies headaches, rhinorrhea, blurry vision, numbness, shortness of breath, chest pain, edema, cough, abdominal pain, nausea, vomiting, diarrhea, dysuria, fevers, rashes or hallucinations unless otherwise stated above in HPI. ____________________________________________   PHYSICAL EXAM:  VITAL SIGNS: Vitals:   10/27/18 0029 10/27/18 0440  BP: 137/88 (!) 98/54  Pulse: (!) 105 91  Resp: 18 16  Temp: 99.6 F (37.6 C)   SpO2: 95% 96%    Constitutional: Alert and oriented. Anxious and tearful appearing Eyes: Conjunctivae are normal.  Head: Atraumatic. Nose: No congestion/rhinnorhea. Mouth/Throat: Mucous membranes are moist.   Neck: No stridor. Painless ROM.  Cardiovascular: Normal rate,  regular rhythm. Grossly normal heart sounds.  Good peripheral circulation. Respiratory: Normal respiratory effort.  No retractions. Lungs CTAB. Gastrointestinal: Soft and nontender. No distention. No abdominal bruits. No CVA tenderness. Genitourinary:  Musculoskeletal: No lower extremity tenderness nor edema.  No joint effusions. Neurologic:  Normal speech and language. No gross focal neurologic deficits are appreciated. No facial droop Skin:  Skin is warm, dry and intact. No rash noted. Psychiatric: Mood and affect are normal. Speech and behavior are normal.  ____________________________________________   LABS (all  labs ordered are listed, but only abnormal results are displayed)  Results for orders placed or performed during the hospital encounter of 10/27/18 (from the past 24 hour(s))  CBC with Differential/Platelet     Status: Abnormal   Collection Time: 10/27/18  1:06 AM  Result Value Ref Range   WBC 2.4 (L) 4.0 - 10.5 K/uL   RBC 4.88 3.87 - 5.11 MIL/uL   Hemoglobin 14.0 12.0 - 15.0 g/dL   HCT 41.8 36.0 - 46.0 %   MCV 85.7 80.0 - 100.0 fL   MCH 28.7 26.0 - 34.0 pg   MCHC 33.5 30.0 - 36.0 g/dL   RDW 13.2 11.5 - 15.5 %   Platelets 157 150 - 400 K/uL   nRBC 0.0 0.0 - 0.2 %   Neutrophils Relative % 37 %   Neutro Abs 0.9 (L) 1.7 - 7.7 K/uL   Lymphocytes Relative 39 %   Lymphs Abs 0.9 0.7 - 4.0 K/uL   Monocytes Relative 14 %   Monocytes Absolute 0.3 0.1 - 1.0 K/uL   Eosinophils Relative 2 %   Eosinophils Absolute 0.1 0.0 - 0.5 K/uL   Basophils Relative 2 %   Basophils Absolute 0.0 0.0 - 0.1 K/uL   WBC Morphology TOXIC GRANULATION    Smear Review MORPHOLOGY UNREMARKABLE    Immature Granulocytes 6 %   Abs Immature Granulocytes 0.15 (H) 0.00 - 0.07 K/uL   Dimorphism PRESENT   Comprehensive metabolic panel     Status: Abnormal   Collection Time: 10/27/18  1:06 AM  Result Value Ref Range   Sodium 139 135 - 145 mmol/L   Potassium 3.7 3.5 - 5.1 mmol/L   Chloride 104 98 - 111 mmol/L   CO2 25 22 - 32 mmol/L   Glucose, Bld 111 (H) 70 - 99 mg/dL   BUN 15 6 - 20 mg/dL   Creatinine, Ser 1.08 (H) 0.44 - 1.00 mg/dL   Calcium 9.1 8.9 - 10.3 mg/dL   Total Protein 7.0 6.5 - 8.1 g/dL   Albumin 3.9 3.5 - 5.0 g/dL   AST 29 15 - 41 U/L   ALT 31 0 - 44 U/L   Alkaline Phosphatase 70 38 - 126 U/L   Total Bilirubin 0.8 0.3 - 1.2 mg/dL   GFR calc non Af Amer 59 (L) >60 mL/min   GFR calc Af Amer >60 >60 mL/min   Anion gap 10 5 - 15  Troponin I - ONCE - STAT     Status: None   Collection Time: 10/27/18  1:06 AM  Result Value Ref Range   Troponin I <0.03 <0.03 ng/mL  Troponin I - ONCE - STAT     Status:  None   Collection Time: 10/27/18  4:16 AM  Result Value Ref Range   Troponin I <0.03 <0.03 ng/mL   ____________________________________________  EKG My review and personal interpretation at Time: 0:30   Indication: chest pain  Rate: 110  Rhythm: sinus Axis: normal  Other: normal  intervals, no stemi ____________________________________________  RADIOLOGY  I personally reviewed all radiographic images ordered to evaluate for the above acute complaints and reviewed radiology reports and findings.  These findings were personally discussed with the patient.  Please see medical record for radiology report.  ____________________________________________   PROCEDURES  Procedure(s) performed:  Procedures    Critical Care performed: no ____________________________________________   INITIAL IMPRESSION / ASSESSMENT AND PLAN / ED COURSE  Pertinent labs & imaging results that were available during my care of the patient were reviewed by me and considered in my medical decision making (see chart for details).   DDX: ACS, pericarditis, esophagitis, boerhaaves, pe, dissection, pna, bronchitis, costochondritis   Nyema Catalea Labrecque is a 47 y.o. who presents to the ED with symptoms as described above.  Patient is AFVSS in ED. Exam as above. Given current presentation have considered the above differential.  Will give IV fluids as well as symptomatic management.  Given her chest pain patient is at high risk for PE therefore will order CT angiogram to evaluate for blood differential.  EKG does not show any evidence of any dynamic or ischemic changes at this time but will order serial enzymes to exclude ACS.  The patient will be placed on continuous pulse oximetry and telemetry for monitoring.  Laboratory evaluation will be sent to evaluate for the above complaints.     Clinical Course as of Oct 27 542  Tue Oct 27, 2018  3151 CT imaging does not show any evidence of PE or other thoracic  pathology at this time.  Will give nebulizer trial course of treatment for bronchitis to see if that makes a difference.  Do feel patient will require serial enzymes.   [PR]  [phone] Patient reassessed.  And felt that she did have some improvement after breathing treatment.  Repeat troponin is negative.   [PR]  [phone] Patient with weakness and dizziness with standing and ambulation.  Still feels dehydrated and states she is having trouble keeping up with p.o. intake status post chemotherapy.  Will discuss with hospitalist for admission for IV fluids and further symptomatic management.   [PR]    Clinical Course User Index [PR] Merlyn Lot, MD     As part of my medical decision making, I reviewed the following data within the [facility] notes reviewed and incorporated, Labs reviewed, notes from prior ED visits.  ____________________________________________   FINAL CLINICAL IMPRESSION(S) / ED DIAGNOSES  Final diagnoses:  Chest pain, unspecified type  SOB (shortness of breath)      NEW MEDICATIONS STARTED DURING THIS VISIT:  New Prescriptions   No medications on file     Note:  This document was prepared using Dragon voice recognition software and may include unintentional dictation errors.    Merlyn Lot, MD 10/27/18 [phone]

## 2018-10-27 NOTE — H&P (Addendum)
[place] [facility] [place] NAME: Caitlin Holt    MR#:  [id number]  DATE OF BIRTH:  1971/09/29  DATE OF ADMISSION:  10/27/2018  PRIMARY CARE PHYSICIAN: [place], [facility]   REQUESTING/REFERRING PHYSICIAN: Merlyn Lot, MD  CHIEF COMPLAINT:   Chief Complaint  Patient presents with  . Chest Pain    HISTORY OF PRESENT ILLNESS:  Caitlin Holt  is a 47 y.o. female with a known history of L Br Ca (s/p mast + ALND, on ChemoTx, last Tx 10/21/2018) p/w CP. Pt states that she had a full chemotherapy session on 11/20. She says the infusion was briefly stopped due to a reaction, but she was treated and the infusion was resumed/completed. She states she developed nausea almost immediately after the infusion, which she says has been chronic and unabated since. She denies vomiting. She endorses dehydration and poor appetite, w/ minimal PO intake. She endorses a frontal headache on Sunday 11/24. Endorses chills, body aches/myalgias as well (but denies fever). Denies cough or urinary symptoms. Yesterday (Monday 11/25), pt states she was at rest when she developed midchest sharp pain, coming & going, worse w/ movement and change in position, worse w/ cough/deep inspiration. Endorses associated SOB. Chest wall (+) TTP on exam, likely pleurisy/costochondritis. CTA chest (-) PE or other abnl. Trop-I (-), EKG (-).  PAST MEDICAL HISTORY:   Past Medical History:  Diagnosis Date  . Breast cancer ([place])   . Ovarian cyst     PAST SURGICAL HISTORY:   Past Surgical History:  Procedure Laterality Date  . ABDOMINAL HYSTERECTOMY    . TUBAL LIGATION      SOCIAL HISTORY:   Social History   Tobacco Use  . Smoking status: Current Every Day Smoker    Packs/day: 1.00    Years: 15.00    Pack years: 15.00    Types: E-cigarettes  . Smokeless tobacco: Never Used  Substance Use Topics  . Alcohol use: Yes    Comment: occ    FAMILY HISTORY:    Family History  Problem Relation Age of Onset  . Breast cancer Mother   . Breast cancer Maternal Grandmother   . Breast cancer Maternal Aunt     DRUG ALLERGIES:   Allergies  Allergen Reactions  . Codeine Itching  . Morphine Swelling    REVIEW OF SYSTEMS:   Review of Systems  Constitutional: Positive for chills and malaise/fatigue. Negative for diaphoresis, fever and weight loss.  HENT: Negative for congestion, ear pain, hearing loss, nosebleeds, sinus pain, sore throat and tinnitus.   Eyes: Negative for blurred vision, double vision and photophobia.  Respiratory: Positive for shortness of breath. Negative for cough, hemoptysis, sputum production and wheezing.   Cardiovascular: Positive for chest pain. Negative for palpitations, orthopnea, claudication, leg swelling and PND.  Gastrointestinal: Positive for nausea. Negative for abdominal pain, blood in stool, constipation, diarrhea, heartburn, melena and vomiting.  Genitourinary: Negative for dysuria, frequency, hematuria and urgency.  Musculoskeletal: Positive for myalgias. Negative for back pain, falls, joint pain and neck pain.  Skin: Negative for itching and rash.  Neurological: Positive for weakness. Negative for dizziness, tingling, tremors, sensory change, speech change, focal weakness, seizures, loss of consciousness and headaches.  Psychiatric/Behavioral: Negative for memory loss. The patient does not have insomnia.    MEDICATIONS AT HOME:   Prior to Admission medications   Medication Sig Start Date End Date Taking? Authorizing Provider  dexamethasone (DECADRON) 4 MG tablet Take 2 tablets by  mouth at 8am & 5pm one day BEFORE chemo, 8am & 5pm on Day 2, 8am on Day 3, then as directed. 10/06/18  Yes [provider]  loratadine (CLARITIN) 10 MG tablet Take 10 mg by mouth daily.    Yes [provider]  ondansetron (ZOFRAN) 8 MG tablet Take 16 mg by mouth every 8 (eight) hours as needed.  10/06/18  Yes  [provider]  prochlorperazine (COMPAZINE) 10 MG tablet Take 10 mg by mouth every 8 (eight) hours as needed.  10/06/18  Yes [provider]      VITAL SIGNS:  Blood pressure (!) 98/54, pulse 91, temperature 98.8 F (37.1 C), temperature source Oral, resp. rate 16, height 5\' 4"  (1.626 m), weight 95.3 kg, last menstrual period 04/04/2016, SpO2 96 %.  PHYSICAL EXAMINATION:  Physical Exam  Constitutional: She is oriented to person, place, and time. She appears well-developed and well-nourished. She is active and cooperative.  Non-toxic appearance. She does not have a sickly appearance. She appears ill. No distress. She is not intubated.  HENT:  Head: Atraumatic.  Mouth/Throat: Oropharynx is clear and moist. No oropharyngeal exudate.  Eyes: Conjunctivae, EOM and lids are normal. No scleral icterus.  Neck: Neck supple. No JVD present. No thyromegaly present.  Cardiovascular: Normal rate, regular rhythm, S1 normal, S2 normal and normal heart sounds.  No extrasystoles are present. Exam reveals no gallop, no S3, no S4, no distant heart sounds and no friction rub.  No murmur heard. Pulmonary/Chest: Effort normal. No accessory muscle usage or stridor. No apnea, no tachypnea and no bradypnea. She is not intubated. No respiratory distress. She has decreased breath sounds in the right upper field, the right middle field, the right lower field, the left upper field, the left middle field and the left lower field. She has no wheezes. She has no rhonchi. She has no rales. She exhibits tenderness.  Abdominal: Soft. She exhibits no distension. Bowel sounds are decreased. There is no tenderness. There is no rigidity, no rebound and no guarding.  Musculoskeletal: Normal range of motion. She exhibits no edema or tenderness.       Right lower leg: Normal. She exhibits no tenderness and no edema.       Left lower leg: Normal. She exhibits no tenderness and no edema.  Lymphadenopathy:    She has  no cervical adenopathy.  Neurological: She is alert and oriented to person, place, and time. She is not disoriented.  Skin: Skin is warm and dry. No rash noted. She is not diaphoretic. No erythema.  Psychiatric: She has a normal mood and affect. Her speech is normal and behavior is normal. Judgment and thought content normal. Her mood appears not anxious. She is not agitated. Cognition and memory are normal.   LABORATORY PANEL:   CBC Recent Labs  Lab 10/27/18 0106  WBC 2.4*  HGB 14.0  HCT 41.8  PLT 157   ------------------------------------------------------------------------------------------------------------------  Chemistries  Recent Labs  Lab 10/27/18 0106  NA 139  K 3.7  CL 104  CO2 25  GLUCOSE 111*  BUN 15  CREATININE 1.08*  CALCIUM 9.1  AST 29  ALT 31  ALKPHOS 70  BILITOT 0.8   ------------------------------------------------------------------------------------------------------------------  Cardiac Enzymes Recent Labs  Lab 10/27/18 0416  TROPONINI <0.03   ------------------------------------------------------------------------------------------------------------------  RADIOLOGY:  Dg Chest 2 View  Result Date: 10/27/2018 CLINICAL DATA:  Chest pain and shortness of breath beginning yesterday. Undergoing chemotherapy for breast carcinoma. EXAM: CHEST - 2 VIEW COMPARISON:  02/20/2014  FINDINGS: The heart size and mediastinal contours are within normal limits. Both lungs are clear. Surgical clips noted in the left breast and axilla. The the visualized skeletal structures are unremarkable. IMPRESSION: No active cardiopulmonary disease. Electronically Signed   By: Earle Gell M.D.   On: 10/27/2018 01:33   Ct Angio Chest Pe W And/or Wo Contrast  Result Date: 10/27/2018 CLINICAL DATA:  PE suspected, high pretest prob.  Chest pain. EXAM: CT ANGIOGRAPHY CHEST WITH CONTRAST TECHNIQUE: Multidetector CT imaging of the chest was performed using the standard protocol  during bolus administration of intravenous contrast. Multiplanar CT image reconstructions and MIPs were obtained to evaluate the vascular anatomy. CONTRAST:  64mL OMNIPAQUE IOHEXOL 350 MG/ML SOLN COMPARISON:  Radiograph earlier this day. FINDINGS: Cardiovascular: There are no filling defects within the pulmonary arteries to suggest pulmonary embolus, allowing for mild breathing motion artifact. Normal caliber thoracic aorta without dissection. Common origin of the brachiocephalic and left common carotid artery, normal variant. No pericardial effusion. Mediastinum/Nodes: No mediastinal, hilar, or axillary adenopathy. No visualized thyroid nodule. No esophageal wall thickening. Surgical clips in the left axilla. Lungs/Pleura: No consolidation, pleural effusion, or pulmonary edema. No pulmonary mass or suspicious nodule. Trachea and mainstem bronchi are patent. Upper Abdomen: No acute findings. Musculoskeletal: Postsurgical change in the left breast. There are no acute or suspicious osseous abnormalities. Review of the MIP images confirms the above findings. IMPRESSION: No pulmonary embolus or acute intrathoracic abnormality. Electronically Signed   By: Keith Rake M.D.   On: 10/27/2018 03:16   IMPRESSION AND PLAN:   A/P: 32F w/ Br Ca, recent ChemoTx, p/w pleuritic CP/SOB, nausea, poor intake, dehydration. Hyperglycemia, leukopenia w/ mild neutropenia. -Pleuritic CP/SOB: Former smoker. Obese. (-) DM/HTN/HLD. CP pleuritic. CTA chest (-). EKG (-). Troponin (-). Denies Hx chest radiation. Unlikely to be cardiac. Likely inflammatory musculoskeletal pain (costochondritis). Will monitor on Tele w/ continuous cardiac monitoring and trend Trop-I anyhow. ASA, morphine, NTG, O2 PRN. Pain ctrl. Incentive spirometry (splinting). -Nausea, poor intake, dehydration: IVF. -Leukopenia w/ mild neutropenia: WBC 2.4, ANC 900. 2/2 ChemoTx. -c/w home meds/formulary subs. -FEN/GI: Regular diet. -DVT PPx: Lovenox. -Code  status: Full code. -Disposition: Observation < 2 midnights.   All the records are reviewed and case discussed with ED provider. Management plans discussed with the patient, family and they are in agreement.  CODE STATUS: Full code.  TOTAL TIME TAKING CARE OF THIS PATIENT: 75 minutes.    Arta Silence M.D on 10/27/2018 at 6:08 AM  Between 7am to 6pm - Pager - [phone]  After 6pm go to [email or web address] - Proofreader  [facility]  Office  [phone]  CC: Primary care physician; [facility]   Note: This dictation was prepared with Dragon dictation along with smaller phrase technology. Any transcriptional errors that result from this process are unintentional.

## 2018-10-27 NOTE — ED Triage Notes (Signed)
Pt to triage via wheelchair.  Pt has chest pain since yesterday.  Pt reports sob.  Pt has breast cancer.  Chemo treatment last week.  Pt alert.

## 2018-10-27 NOTE — ED Notes (Signed)
Beta HCG entered on wrong patient.

## 2018-10-27 NOTE — ED Notes (Signed)
Pt. Transported to Xray 

## 2018-10-27 NOTE — Progress Notes (Signed)
Same day rounding progress note  24F w/ Br Ca, recent ChemoTx, p/w pleuritic CP/SOB, nausea, poor intake, dehydration. Hyperglycemia, leukopenia w/ mild neutropenia.  *Dizziness: Due to dehydration due to nausea and poor p.o. intake after recent chemo therapy  *Chest pain: Pleuritic in nature --3 serial troponins. this is likely noncardiac  *Nausea: Transient and resolved  *Dehydration -Continue IV fluids and encourage oral intake  Time spent: 15 minutes

## 2018-10-28 LAB — CBC
HEMATOCRIT: 36.3 % (ref 36.0–46.0)
Hemoglobin: 11.7 g/dL — ABNORMAL LOW (ref 12.0–15.0)
MCH: 28 pg (ref 26.0–34.0)
MCHC: 32.2 g/dL (ref 30.0–36.0)
MCV: 86.8 fL (ref 80.0–100.0)
Platelets: 172 10*3/uL (ref 150–400)
RBC: 4.18 MIL/uL (ref 3.87–5.11)
RDW: 13.5 % (ref 11.5–15.5)
WBC: 14.6 10*3/uL — ABNORMAL HIGH (ref 4.0–10.5)
nRBC: 0 % (ref 0.0–0.2)

## 2018-10-28 LAB — BASIC METABOLIC PANEL
ANION GAP: 7 (ref 5–15)
BUN: 8 mg/dL (ref 6–20)
CALCIUM: 8.1 mg/dL — AB (ref 8.9–10.3)
CHLORIDE: 105 mmol/L (ref 98–111)
CO2: 25 mmol/L (ref 22–32)
CREATININE: 0.88 mg/dL (ref 0.44–1.00)
GFR calc non Af Amer: >60 mL/min (ref >60)
Glucose, Bld: 96 mg/dL (ref 70–99)
Potassium: 3.7 mmol/L (ref 3.5–5.1)
SODIUM: 137 mmol/L (ref 135–145)

## 2018-10-28 LAB — HIV ANTIBODY (ROUTINE TESTING W REFLEX): HIV Screen 4th Generation wRfx: NONREACTIVE

## 2018-10-28 NOTE — Discharge Instructions (Signed)
Dehydration, Adult Dehydration is when there is not enough fluid or water in your body. This happens when you lose more fluids than you take in. Dehydration can range from mild to very bad. It should be treated right away to keep it from getting very bad. Symptoms of mild dehydration may include:  Thirst.  Dry lips.  Slightly dry mouth.  Dry, warm skin.  Dizziness. Symptoms of moderate dehydration may include:  Very dry mouth.  Muscle cramps.  Dark pee (urine). Pee may be the color of tea.  Your body making less pee.  Your eyes making fewer tears.  Heartbeat that is uneven or faster than normal (palpitations).  Headache.  Light-headedness, especially when you stand up from sitting.  Fainting (syncope). Symptoms of very bad dehydration may include:  Changes in skin, such as: ? Cold and clammy skin. ? Blotchy (mottled) or pale skin. ? Skin that does not quickly return to normal after being lightly pinched and let go (poor skin turgor).  Changes in body fluids, such as: ? Feeling very thirsty. ? Your eyes making fewer tears. ? Not sweating when body temperature is high, such as in hot weather. ? Your body making very little pee.  Changes in vital signs, such as: ? Weak pulse. ? Pulse that is more than 100 beats a minute when you are sitting still. ? Fast breathing. ? Low blood pressure.  Other changes, such as: ? Sunken eyes. ? Cold hands and feet. ? Confusion. ? Lack of energy (lethargy). ? Trouble waking up from sleep. ? Short-term weight loss. ? Unconsciousness. Follow these instructions at home:  If told by your doctor, drink an ORS: ? Make an ORS by using instructions on the package. ? Start by drinking small amounts, about  cup (120 mL) every 5-10 minutes. ? Slowly drink more until you have had the amount that your doctor said to have.  Drink enough clear fluid to keep your pee clear or pale yellow. If you were told to drink an ORS, finish the ORS  first, then start slowly drinking clear fluids. Drink fluids such as: ? Water. Do not drink only water by itself. Doing that can make the salt (sodium) level in your body get too low (hyponatremia). ? Ice chips. ? Fruit juice that you have added water to (diluted). ? Low-calorie sports drinks.  Avoid: ? Alcohol. ? Drinks that have a lot of sugar. These include high-calorie sports drinks, fruit juice that does not have water added, and soda. ? Caffeine. ? Foods that are greasy or have a lot of fat or sugar.  Take over-the-counter and prescription medicines only as told by your doctor.  Do not take salt tablets. Doing that can make the salt level in your body get too high (hypernatremia).  Eat foods that have minerals (electrolytes). Examples include bananas, oranges, potatoes, tomatoes, and spinach.  Keep all follow-up visits as told by your doctor. This is important. Contact a doctor if:  You have belly (abdominal) pain that: ? Gets worse. ? Stays in one area (localizes).  You have a rash.  You have a stiff neck.  You get angry or annoyed more easily than normal (irritability).  You are more sleepy than normal.  You have a harder time waking up than normal.  You feel: ? Weak. ? Dizzy. ? Very thirsty.  You have peed (urinated) only a small amount of very dark pee during 6-8 hours. Get help right away if:  You have symptoms of   very bad dehydration.  You cannot drink fluids without throwing up (vomiting).  Your symptoms get worse with treatment.  You have a fever.  You have a very bad headache.  You are throwing up or having watery poop (diarrhea) and it: ? Gets worse. ? Does not go away.  You have blood or something green (bile) in your throw-up.  You have blood in your poop (stool). This may cause poop to look black and tarry.  You have not peed in 6-8 hours.  You pass out (faint).  Your heart rate when you are sitting still is more than 100 beats a  minute.  You have trouble breathing. This information is not intended to replace advice given to you by your health care provider. Make sure you discuss any questions you have with your health care provider. Document Released: 09/14/2009 Document Revised: 06/07/2016 Document Reviewed: 01/12/2016 Elsevier Interactive Patient Education  2018 Elsevier Inc.  

## 2018-10-28 NOTE — Plan of Care (Signed)
  Problem: Education: Goal: Knowledge of General Education information will improve Description: Including pain rating scale, medication(s)/side effects and non-pharmacologic comfort measures Outcome: Progressing   Problem: Health Behavior/Discharge Planning: Goal: Ability to manage health-related needs will improve Outcome: Progressing   Problem: Pain Managment: Goal: General experience of comfort will improve Outcome: Progressing   

## 2018-10-28 NOTE — Plan of Care (Signed)
  Problem: Health Behavior/Discharge Planning: Goal: Ability to manage health-related needs will improve Outcome: Adequate for Discharge   Problem: Clinical Measurements: Goal: Ability to maintain clinical measurements within normal limits will improve Outcome: Adequate for Discharge   Problem: Nutrition: Goal: Adequate nutrition will be maintained Outcome: Adequate for Discharge   Problem: Activity: Goal: Risk for activity intolerance will decrease Outcome: Adequate for Discharge

## 2018-10-28 NOTE — Progress Notes (Signed)
IVs and tele removed from patient. Discharge instructions given to patient. Verbalized understanding. No acute distress at this time. Son is at bedside and will transport patient home.

## 2018-10-28 NOTE — Care Management Note (Signed)
Case Management Note  Patient Details  Name: Caitlin Holt MRN: [id number] Date of Birth: December 07, 1970  Subjective/Objective:  Patient is from home; lives alone.  Diagnosed with breast cancer in August. Placed in observation for chest pain.  Discharging today.  She does not have insurance.  Her PCP is [facility] where she receives charity care. She does not receive assistance with prescriptions.  She is unemployed and does not have an income.  Referral to [facility] for medications.  Asked Dr. Manuella Ghazi to print discharge prescriptions so this RNCM can fax them to the [facility].  Her son will be picking her up today for discharge.  She has applied for disability.  Otherwise she does not have any further needs at this time.                       Action/Plan:   Expected Discharge Date:  10/28/18               Expected Discharge Plan:  Home/Self Care  In-House Referral:     Discharge planning Services  CM Consult  Post Acute Care Choice:    Choice offered to:     DME Arranged:    DME Agency:     HH Arranged:    HH Agency:     Status of Service:  Completed, signed off  If discussed at [employer] of Stay Meetings, dates discussed:    Additional Comments:  Elza Rafter, RN 10/28/2018, 9:23 AM

## 2018-10-28 NOTE — Discharge Summary (Signed)
[place] at [place] NAME: Caitlin Holt    MR#:  [id number]  DATE OF BIRTH:  [birth year]  DATE OF ADMISSION:  10/27/2018   ADMITTING PHYSICIAN: Arta Silence, MD  DATE OF DISCHARGE: 10/28/2018 11:22 AM  PRIMARY CARE PHYSICIAN: [facility]   ADMISSION DIAGNOSIS:  SOB (shortness of breath) [R06.02] Chest pain, unspecified type [R07.9] DISCHARGE DIAGNOSIS:  Active Problems:   Chest pain  SECONDARY DIAGNOSIS:   Past Medical History:  Diagnosis Date  . Breast cancer ([place])   . Ovarian cyst    HOSPITAL COURSE:  61F w/ Br Ca, recent ChemoTx, p/w pleuritic CP/SOB, nausea, poor intake, dehydration. Hyperglycemia, leukopenia w/ mild neutropenia.  *Dizziness: Due to dehydration due to nausea and poor p.o. intake after recent chemo therapy - now resolved   *Chest pain: Pleuritic in nature --3 serial troponins. noncardiac  *Nausea: Transient and resolved  *Dehydration -Resolved with IV fluids and encouraged oral intake  DISCHARGE CONDITIONS:  stable CONSULTS OBTAINED:  Treatment Team:  Arta Silence, MD DRUG ALLERGIES:   Allergies  Allergen Reactions  . Codeine Itching  . Morphine Swelling   DISCHARGE MEDICATIONS:   Allergies as of 10/28/2018      Reactions   Codeine Itching   Morphine Swelling      Medication List    TAKE these medications   dexamethasone 4 MG tablet Commonly known as:  DECADRON Take 2 tablets by mouth at 8am & 5pm one day BEFORE chemo, 8am & 5pm on Day 2, 8am on Day 3, then as directed.   loratadine 10 MG tablet Commonly known as:  CLARITIN Take 10 mg by mouth daily.   ondansetron 8 MG tablet Commonly known as:  ZOFRAN Take 16 mg by mouth every 8 (eight) hours as needed.   prochlorperazine 10 MG tablet Commonly known as:  COMPAZINE Take 10 mg by mouth every 8 (eight) hours as needed.        DISCHARGE INSTRUCTIONS:   DIET:  Regular diet DISCHARGE  CONDITION:  Good ACTIVITY:  Activity as tolerated OXYGEN:  Home Oxygen: No.  Oxygen Delivery: room air DISCHARGE LOCATION:  home   If you experience worsening of your admission symptoms, develop shortness of breath, life threatening emergency, suicidal or homicidal thoughts you must seek medical attention immediately by calling 911 or calling your MD immediately  if symptoms less severe.  You Must read complete instructions/literature along with all the possible adverse reactions/side effects for all the Medicines you take and that have been prescribed to you. Take any new Medicines after you have completely understood and accpet all the possible adverse reactions/side effects.   Please note  You were cared for by a hospitalist during your hospital stay. If you have any questions about your discharge medications or the care you received while you were in the hospital after you are discharged, you can call the unit and asked to speak with the hospitalist on call if the hospitalist that took care of you is not available. Once you are discharged, your primary care physician will handle any further medical issues. Please note that NO REFILLS for any discharge medications will be authorized once you are discharged, as it is imperative that you return to your primary care physician (or establish a relationship with a primary care physician if you do not have one) for your aftercare needs so that they can reassess your need for medications and monitor your lab values.  On the day of Discharge:  VITAL SIGNS:  Blood pressure (!) 97/54, pulse 69, temperature 98 F (36.7 C), temperature source Oral, resp. rate 17, height 5\' 4"  (1.626 m), weight 95.3 kg, last menstrual period 04/04/2016, SpO2 94 %. PHYSICAL EXAMINATION:  GENERAL:  47 y.o.-year-old patient lying in the bed with no acute distress.  EYES: Pupils equal, round, reactive to light and accommodation. No scleral icterus. Extraocular muscles  intact.  HEENT: Head atraumatic, normocephalic. Oropharynx and nasopharynx clear.  NECK:  Supple, no jugular venous distention. No thyroid enlargement, no tenderness.  LUNGS: Normal breath sounds bilaterally, no wheezing, rales,rhonchi or crepitation. No use of accessory muscles of respiration.  CARDIOVASCULAR: S1, S2 normal. No murmurs, rubs, or gallops.  ABDOMEN: Soft, non-tender, non-distended. Bowel sounds present. No organomegaly or mass.  EXTREMITIES: No pedal edema, cyanosis, or clubbing.  NEUROLOGIC: Cranial nerves II through XII are intact. Muscle strength 5/5 in all extremities. Sensation intact. Gait not checked.  PSYCHIATRIC: The patient is alert and oriented x 3.  SKIN: No obvious rash, lesion, or ulcer.  DATA REVIEW:   CBC Recent Labs  Lab 10/28/18 0328  WBC 14.6*  HGB 11.7*  HCT 36.3  PLT 172    Chemistries  Recent Labs  Lab 10/27/18 0106 10/27/18 0841 10/28/18 0328  NA 139  --  137  K 3.7  --  3.7  CL 104  --  105  CO2 25  --  25  GLUCOSE 111*  --  96  BUN 15  --  8  CREATININE 1.08*  --  0.88  CALCIUM 9.1  --  8.1*  MG  --  1.8  --   AST 29  --   --   ALT 31  --   --   ALKPHOS 70  --   --   BILITOT 0.8  --   --      Follow-up Information    [place], [facility]. Schedule an appointment as soon as possible for a visit on 11/02/2018.   Specialty:  Family Medicine Why:  Appointment Time: @ 1:20pm Contact information: [place] [id number] [phone]            Management plans discussed with the patient, family and they are in agreement.  CODE STATUS: Prior   TOTAL TIME TAKING CARE OF THIS PATIENT: 45 minutes.    Max Sane M.D on 10/28/2018 at 8:17 PM  Between 7am to 6pm - Pager - [phone]  After 6pm go to [email or web address] - Proofreader  [facility]  Office  [phone]  CC: Primary care physician; [facility]   Note: This  dictation was prepared with Dragon dictation along with smaller phrase technology. Any transcriptional errors that result from this process are unintentional.

## 2018-12-05 ENCOUNTER — Other Ambulatory Visit: Payer: Self-pay

## 2018-12-05 ENCOUNTER — Emergency Department
Admission: EM | Admit: 2018-12-05 | Discharge: 2018-12-05 | Disposition: A | Discharge status: Home / Self Care | Payer: Self-pay | Attending: Emergency Medicine | Admitting: Emergency Medicine

## 2018-12-05 ENCOUNTER — Emergency Department: Payer: Self-pay

## 2018-12-05 DIAGNOSIS — R509 Fever, unspecified: Secondary | ICD-10-CM | POA: Insufficient documentation

## 2018-12-05 DIAGNOSIS — Z5321 Procedure and treatment not carried out due to patient leaving prior to being seen by health care provider: Secondary | ICD-10-CM | POA: Insufficient documentation

## 2018-12-05 LAB — COMPREHENSIVE METABOLIC PANEL
ALK PHOS: 94 U/L (ref 38–126)
ALT: 26 U/L (ref 0–44)
AST: 22 U/L (ref 15–41)
Albumin: 3.8 g/dL (ref 3.5–5.0)
Anion gap: 6 (ref 5–15)
BUN: 18 mg/dL (ref 6–20)
CHLORIDE: 102 mmol/L (ref 98–111)
CO2: 24 mmol/L (ref 22–32)
Calcium: 8.6 mg/dL — ABNORMAL LOW (ref 8.9–10.3)
Creatinine, Ser: 0.92 mg/dL (ref 0.44–1.00)
GFR calc Af Amer: >60 mL/min (ref >60)
GLUCOSE: 93 mg/dL (ref 70–99)
POTASSIUM: 3.7 mmol/L (ref 3.5–5.1)
SODIUM: 132 mmol/L — AB (ref 135–145)
Total Bilirubin: 0.7 mg/dL (ref 0.3–1.2)
Total Protein: 6.6 g/dL (ref 6.5–8.1)

## 2018-12-05 LAB — CBC WITH DIFFERENTIAL/PLATELET
ABS IMMATURE GRANULOCYTES: 0 10*3/uL (ref 0.00–0.07)
BASOS PCT: 0 %
Basophils Absolute: 0 10*3/uL (ref 0.0–0.1)
Eosinophils Absolute: 0.1 10*3/uL (ref 0.0–0.5)
Eosinophils Relative: 1 %
HCT: 35.4 % — ABNORMAL LOW (ref 36.0–46.0)
Hemoglobin: 11.6 g/dL — ABNORMAL LOW (ref 12.0–15.0)
LYMPHS PCT: 15 %
Lymphs Abs: 1 10*3/uL (ref 0.7–4.0)
MCH: 28.4 pg (ref 26.0–34.0)
MCHC: 32.8 g/dL (ref 30.0–36.0)
MCV: 86.6 fL (ref 80.0–100.0)
MONO ABS: 0.2 10*3/uL (ref 0.1–1.0)
Monocytes Relative: 3 %
NRBC: 0 % (ref 0.0–0.2)
Neutro Abs: 5.3 10*3/uL (ref 1.7–7.7)
Neutrophils Relative %: 81 %
Platelets: 183 10*3/uL (ref 150–400)
RBC: 4.09 MIL/uL (ref 3.87–5.11)
RDW: 15.3 % (ref 11.5–15.5)
WBC: 6.6 10*3/uL (ref 4.0–10.5)

## 2018-12-05 LAB — PROTIME-INR: Prothrombin Time: <10 seconds — ABNORMAL LOW (ref 11.4–15.2)

## 2018-12-05 LAB — INFLUENZA PANEL BY PCR (TYPE A & B)
Influenza A By PCR: NEGATIVE
Influenza B By PCR: NEGATIVE

## 2018-12-05 MED ORDER — IBUPROFEN 800 MG PO TABS
800.0000 mg | ORAL_TABLET | Freq: Once | ORAL | Status: AC
  Administered 2018-12-05: 800 mg via ORAL
  Filled 2018-12-05: qty 1

## 2018-12-05 NOTE — ED Notes (Signed)
Pt ambulatory with steady gait out of ED at this time. Pt in NAD.

## 2018-12-05 NOTE — ED Triage Notes (Addendum)
Patient reports symptoms since yesterday - fever, chills and decreased appetite with nausea.  Patient actively receiving chemo treatments.

## 2018-12-05 NOTE — ED Notes (Signed)
Flu swab/ 1 set of Blood Cultures, and a lav/blue and green top blood specimen was sent to lab by this EDT

## 2018-12-05 NOTE — ED Notes (Addendum)
I- Stat Lactic = 1.66

## 2018-12-05 NOTE — ED Notes (Signed)
Pt asking in sub wait about wait time. Pt updated on time and situation at this time.

## 2018-12-06 LAB — CG4 I-STAT (LACTIC ACID): Lactic Acid, Venous: 1.66 mmol/L (ref 0.5–1.9)

## 2018-12-10 LAB — CULTURE, BLOOD (ROUTINE X 2): CULTURE: NO GROWTH

## 2019-11-20 ENCOUNTER — Other Ambulatory Visit: Payer: Self-pay

## 2019-11-20 ENCOUNTER — Encounter: Payer: Self-pay | Admitting: Emergency Medicine

## 2019-11-20 DIAGNOSIS — M545 Low back pain: Secondary | ICD-10-CM | POA: Insufficient documentation

## 2019-11-20 DIAGNOSIS — Z5321 Procedure and treatment not carried out due to patient leaving prior to being seen by health care provider: Secondary | ICD-10-CM | POA: Insufficient documentation

## 2019-11-20 DIAGNOSIS — U071 COVID-19: Secondary | ICD-10-CM | POA: Diagnosis present

## 2019-11-20 DIAGNOSIS — R1030 Lower abdominal pain, unspecified: Secondary | ICD-10-CM | POA: Insufficient documentation

## 2019-11-20 DIAGNOSIS — R0602 Shortness of breath: Secondary | ICD-10-CM | POA: Diagnosis not present

## 2019-11-20 LAB — CBC WITH DIFFERENTIAL/PLATELET
Abs Immature Granulocytes: 0.02 10*3/uL (ref 0.00–0.07)
Basophils Absolute: 0 10*3/uL (ref 0.0–0.1)
Basophils Relative: 1 %
Eosinophils Absolute: 0 10*3/uL (ref 0.0–0.5)
Eosinophils Relative: 1 %
HCT: 38.7 % (ref 36.0–46.0)
Hemoglobin: 12.4 g/dL (ref 12.0–15.0)
Immature Granulocytes: 1 %
Lymphocytes Relative: 15 %
Lymphs Abs: 0.5 10*3/uL — ABNORMAL LOW (ref 0.7–4.0)
MCH: 27.6 pg (ref 26.0–34.0)
MCHC: 32 g/dL (ref 30.0–36.0)
MCV: 86.2 fL (ref 80.0–100.0)
Monocytes Absolute: 0.3 10*3/uL (ref 0.1–1.0)
Monocytes Relative: 8 %
Neutro Abs: 2.4 10*3/uL (ref 1.7–7.7)
Neutrophils Relative %: 74 %
Platelets: 174 10*3/uL (ref 150–400)
RBC: 4.49 MIL/uL (ref 3.87–5.11)
RDW: 14.4 % (ref 11.5–15.5)
WBC: 3.1 10*3/uL — ABNORMAL LOW (ref 4.0–10.5)
nRBC: 0 % (ref 0.0–0.2)

## 2019-11-20 LAB — COMPREHENSIVE METABOLIC PANEL
ALT: 28 U/L (ref 0–44)
AST: 27 U/L (ref 15–41)
Albumin: 4.1 g/dL (ref 3.5–5.0)
Alkaline Phosphatase: 74 U/L (ref 38–126)
Anion gap: 11 (ref 5–15)
BUN: 12 mg/dL (ref 6–20)
CO2: 22 mmol/L (ref 22–32)
Calcium: 9.3 mg/dL (ref 8.9–10.3)
Chloride: 107 mmol/L (ref 98–111)
Creatinine, Ser: 1.18 mg/dL — ABNORMAL HIGH (ref 0.44–1.00)
GFR calc Af Amer: >60 mL/min (ref >60)
GFR calc non Af Amer: 54 mL/min — ABNORMAL LOW (ref >60)
Glucose, Bld: 100 mg/dL — ABNORMAL HIGH (ref 70–99)
Potassium: 3.9 mmol/L (ref 3.5–5.1)
Sodium: 140 mmol/L (ref 135–145)
Total Bilirubin: 0.5 mg/dL (ref 0.3–1.2)
Total Protein: 7.4 g/dL (ref 6.5–8.1)

## 2019-11-20 NOTE — ED Triage Notes (Signed)
Patient states that she was diagnosed with covid on Wednesday. Patient with complaint of weakness, lower back pain radiating to left lower abdomen, fever and shortness of breath. Patient states that her temperature was 101 and she took tylenol at 19:30. Patient also states pain in her left ear.

## 2019-11-21 ENCOUNTER — Emergency Department
Admission: EM | Admit: 2019-11-21 | Discharge: 2019-11-21 | Disposition: A | Discharge status: Home / Self Care | Payer: HRSA Program | Attending: Emergency Medicine | Admitting: Emergency Medicine

## 2019-11-22 ENCOUNTER — Emergency Department
Admission: EM | Admit: 2019-11-22 | Discharge: 2019-11-22 | Disposition: A | Discharge status: Home / Self Care | Payer: HRSA Program | Attending: Emergency Medicine | Admitting: Emergency Medicine

## 2019-11-22 ENCOUNTER — Emergency Department: Payer: HRSA Program

## 2019-11-22 ENCOUNTER — Other Ambulatory Visit: Payer: Self-pay

## 2019-11-22 DIAGNOSIS — R1032 Left lower quadrant pain: Secondary | ICD-10-CM

## 2019-11-22 DIAGNOSIS — U071 COVID-19: Secondary | ICD-10-CM | POA: Diagnosis not present

## 2019-11-22 DIAGNOSIS — Z87891 Personal history of nicotine dependence: Secondary | ICD-10-CM | POA: Insufficient documentation

## 2019-11-22 DIAGNOSIS — R109 Unspecified abdominal pain: Secondary | ICD-10-CM | POA: Diagnosis present

## 2019-11-22 DIAGNOSIS — Z79899 Other long term (current) drug therapy: Secondary | ICD-10-CM | POA: Insufficient documentation

## 2019-11-22 DIAGNOSIS — J1289 Other viral pneumonia: Secondary | ICD-10-CM | POA: Diagnosis not present

## 2019-11-22 DIAGNOSIS — J1282 Pneumonia due to coronavirus disease 2019: Secondary | ICD-10-CM

## 2019-11-22 LAB — CBC
HCT: 39 % (ref 36.0–46.0)
Hemoglobin: 12.4 g/dL (ref 12.0–15.0)
MCH: 27.3 pg (ref 26.0–34.0)
MCHC: 31.8 g/dL (ref 30.0–36.0)
MCV: 85.7 fL (ref 80.0–100.0)
Platelets: 161 10*3/uL (ref 150–400)
RBC: 4.55 MIL/uL (ref 3.87–5.11)
RDW: 14.4 % (ref 11.5–15.5)
WBC: 3.3 10*3/uL — ABNORMAL LOW (ref 4.0–10.5)
nRBC: 0 % (ref 0.0–0.2)

## 2019-11-22 LAB — URINALYSIS, COMPLETE (UACMP) WITH MICROSCOPIC
Bacteria, UA: NONE SEEN
Bilirubin Urine: NEGATIVE
Glucose, UA: NEGATIVE mg/dL
Hgb urine dipstick: NEGATIVE
Ketones, ur: NEGATIVE mg/dL
Leukocytes,Ua: NEGATIVE
Nitrite: NEGATIVE
Protein, ur: NEGATIVE mg/dL
Specific Gravity, Urine: 1.001 — ABNORMAL LOW (ref 1.005–1.030)
pH: 6 (ref 5.0–8.0)

## 2019-11-22 LAB — COMPREHENSIVE METABOLIC PANEL
ALT: 38 U/L (ref 0–44)
AST: 39 U/L (ref 15–41)
Albumin: 4.1 g/dL (ref 3.5–5.0)
Alkaline Phosphatase: 72 U/L (ref 38–126)
Anion gap: 9 (ref 5–15)
BUN: 12 mg/dL (ref 6–20)
CO2: 24 mmol/L (ref 22–32)
Calcium: 9.1 mg/dL (ref 8.9–10.3)
Chloride: 108 mmol/L (ref 98–111)
Creatinine, Ser: 0.84 mg/dL (ref 0.44–1.00)
GFR calc Af Amer: >60 mL/min (ref >60)
GFR calc non Af Amer: >60 mL/min (ref >60)
Glucose, Bld: 99 mg/dL (ref 70–99)
Potassium: 4.1 mmol/L (ref 3.5–5.1)
Sodium: 141 mmol/L (ref 135–145)
Total Bilirubin: 0.5 mg/dL (ref 0.3–1.2)
Total Protein: 7.5 g/dL (ref 6.5–8.1)

## 2019-11-22 LAB — LIPASE, BLOOD: Lipase: 24 U/L (ref 11–51)

## 2019-11-22 MED ORDER — DICYCLOMINE HCL 20 MG PO TABS: 20.0000 mg | ORAL_TABLET | Freq: Three times a day (TID) | ORAL | 0 refills | Status: DC | PRN

## 2019-11-22 MED ORDER — ONDANSETRON HCL 4 MG/2ML IJ SOLN
4.0000 mg | Freq: Once | INTRAMUSCULAR | Status: AC
  Administered 2019-11-22: 4 mg via INTRAVENOUS
  Filled 2019-11-22: qty 2

## 2019-11-22 MED ORDER — MORPHINE SULFATE (PF) 4 MG/ML IV SOLN
4.0000 mg | Freq: Once | INTRAVENOUS | Status: AC
  Administered 2019-11-22: 4 mg via INTRAVENOUS
  Filled 2019-11-22: qty 1

## 2019-11-22 MED ORDER — ONDANSETRON 4 MG PO TBDP: 4.0000 mg | ORAL_TABLET | Freq: Three times a day (TID) | ORAL | 0 refills | Status: AC | PRN

## 2019-11-22 MED ORDER — KETOROLAC TROMETHAMINE 30 MG/ML IJ SOLN
30.0000 mg | Freq: Once | INTRAMUSCULAR | Status: AC
  Administered 2019-11-22: 13:00:00 30 mg via INTRAVENOUS
  Filled 2019-11-22: qty 1

## 2019-11-22 MED ORDER — SODIUM CHLORIDE 0.9% FLUSH: 3.0000 mL | Freq: Once | INTRAVENOUS | Status: DC

## 2019-11-22 MED ORDER — ONDANSETRON 4 MG PO TBDP: 4.0000 mg | ORAL_TABLET | Freq: Three times a day (TID) | ORAL | 0 refills | Status: DC | PRN

## 2019-11-22 MED ORDER — DICYCLOMINE HCL 20 MG PO TABS: 20.0000 mg | ORAL_TABLET | Freq: Three times a day (TID) | ORAL | 0 refills | Status: AC | PRN

## 2019-11-22 NOTE — ED Triage Notes (Signed)
Pt c/o LLQ pain that radiates around to the back for the past 4 days with nausea. Denies vomiting or diarrhea. Denies hx of kidney stones.

## 2019-11-22 NOTE — ED Notes (Signed)
Patient transported to CT 

## 2019-11-22 NOTE — ED Provider Notes (Signed)
[facility] Emergency Department Provider Note   ____________________________________________    I have reviewed the triage vital signs and the nursing notes.   HISTORY  Chief Complaint Abdominal Pain     HPI Caitlin Holt is a 48 y.o. female who presents with complaints of abdominal pain.  Patient describes severe left lower quadrant abdominal pain with some radiation to her back.  She notes this is been ongoing for 4 days but is steadily worsened.  She was recently diagnosed with COVID-19.  No shortness of breath.  Mild cough.  Some chills and fever.  Denies dysuria, no history of kidney stones.  No history of diverticulitis.  Does have a history of an abdominal hysterectomy.  Mild nausea  Past Medical History:  Diagnosis Date  . Breast cancer ([place])   . Ovarian cyst     Patient Active Problem List   Diagnosis Date Noted  . Chest pain 10/27/2018  . Lump or mass in breast 07/14/2013    Past Surgical History:  Procedure Laterality Date  . ABDOMINAL HYSTERECTOMY    . TUBAL LIGATION      Prior to Admission medications   Medication Sig Start Date End Date Taking? Authorizing Provider  dexamethasone (DECADRON) 4 MG tablet Take 2 tablets by mouth at 8am & 5pm one day BEFORE chemo, 8am & 5pm on Day 2, 8am on Day 3, then as directed. 10/06/18   [provider]  loratadine (CLARITIN) 10 MG tablet Take 10 mg by mouth daily.     [provider]  ondansetron (ZOFRAN) 8 MG tablet Take 16 mg by mouth every 8 (eight) hours as needed.  10/06/18   [provider]  prochlorperazine (COMPAZINE) 10 MG tablet Take 10 mg by mouth every 8 (eight) hours as needed.  10/06/18   [provider]     Allergies Codeine and Morphine  Family History  Problem Relation Age of Onset  . Breast cancer Mother   . Breast cancer Maternal Grandmother   . Breast cancer Maternal Aunt     Social History Social History   Tobacco Use    . Smoking status: Former Smoker    Packs/day: 1.00    Years: 15.00    Pack years: 15.00    Types: E-cigarettes  . Smokeless tobacco: Never Used  Substance Use Topics  . Alcohol use: Yes    Comment: occ  . Drug use: No    Review of Systems  Constitutional: As above Eyes: No visual changes.  ENT: No sore throat. Cardiovascular: Denies chest pain. Respiratory: Denies shortness of breath. Gastrointestinal: As above Genitourinary: As above Musculoskeletal: As above Skin: Negative for rash. Neurological: Negative for headaches    ____________________________________________   PHYSICAL EXAM:  VITAL SIGNS: ED Triage Vitals  Enc Vitals Group     BP 11/22/19 1236 136/79     Pulse Rate 11/22/19 1236 88     Resp 11/22/19 1236 17     Temp 11/22/19 1236 98.9 F (37.2 C)     Temp Source 11/22/19 1236 Oral     SpO2 11/22/19 1236 97 %     Weight 11/22/19 1237 106.1 kg (234 lb)     Height 11/22/19 1237 1.626 m (5\' 4" )     Head Circumference --      Peak Flow --      Pain Score 11/22/19 1237 10     Pain Loc --      Pain Edu? --  Excl. in [place]? --     Constitutional: Alert and oriented.  Clearly uncomfortable  Nose: No congestion/rhinnorhea. Mouth/Throat: Mucous membranes are moist.   Neck:  Painless ROM Cardiovascular: Normal rate, regular rhythm. Grossly normal heart sounds.  Good peripheral circulation. Respiratory: Normal respiratory effort.  No retractions. Lungs CTAB. Gastrointestinal: Tenderness palpation left lower quadrant, no distention, no CVA tenderness  Musculoskeletal: Warm and well perfused Neurologic:  Normal speech and language. No gross focal neurologic deficits are appreciated.  Skin:  Skin is warm, dry and intact. No rash noted. Psychiatric: Mood and affect are normal. Speech and behavior are normal.  ____________________________________________   LABS (all labs ordered are listed, but only abnormal results are displayed)  Labs Reviewed  CBC  - Abnormal; Notable for the following components:      Result Value   WBC 3.3 (*)    All other components within normal limits  URINALYSIS, COMPLETE (UACMP) WITH MICROSCOPIC - Abnormal; Notable for the following components:   Color, Urine COLORLESS (*)    APPearance CLEAR (*)    Specific Gravity, Urine 1.001 (*)    All other components within normal limits  LIPASE, BLOOD  COMPREHENSIVE METABOLIC PANEL   ____________________________________________  EKG  None ____________________________________________  RADIOLOGY  KUB unremarkable CT abdomen pelvis no acute abnormality, pneumonia multifocal on the long spine recently ____________________________________________   PROCEDURES  Procedure(s) performed: No  Procedures   Critical Care performed: No ____________________________________________   INITIAL IMPRESSION / ASSESSMENT AND PLAN / ED COURSE  Pertinent labs & imaging results that were available during my care of the patient were reviewed by me and considered in my medical decision making (see chart for details).  Patient presents with left lower quadrant pain in the setting of COVID-19.  Presentation is suspicious for ureterolithiasis versus diverticulitis.  We will treat with IV Toradol while we await urinalysis, labs and check a KUB.  May require CT imaging if KUB/urine not consistent with ureterolithiasis.  Imaging is overall reassuring, patient had some improvement with Toradol, will give morphine as she says she is not actually allergic to this but it causes nausea without Zofran.  She now informs me that she has had this pain intermittently over the last 3 years, this is not a new pain for her.  We will attempt better analgesia and anticipate discharge home    ____________________________________________   FINAL CLINICAL IMPRESSION(S) / ED DIAGNOSES  Final diagnoses:  Left lower quadrant abdominal pain  Pneumonia due to COVID-19 virus        Note:   This document was prepared using Dragon voice recognition software and may include unintentional dictation errors.   Lavonia Drafts, MD 11/22/19 1451

## 2019-11-22 NOTE — Discharge Instructions (Addendum)
Take the prescribed nausea and pain medication (Bentyl) for stomach pain and cramps  Drink plenty of fluids  Return to the ER with intractable pain, nausea/vomiting, or shortness of breath

## 2019-11-22 NOTE — ED Notes (Signed)
Pt verbalized understanding of discharge instructions. NAD at this time. 

## 2019-11-22 NOTE — ED Provider Notes (Signed)
Patient seen by Dr. Corky Downs, please see his notes. Pt here with LLQ abdominal pain in setting of COVID-19 diagnosis. She is not hypoxic and denies any SOB. Labs are overall very reassuring. Mild leukopenia likely 2/2 COVID-19/viral suppression. UA neg. CT scan neg for acute abnormality. Lungs do show changes c/w COVID-19 as to be expected, but there are no clinical signs of bacterial superinfection. Will treat symptomatically, encourage fluids and good return precautions.   Duffy Bruce, MD 11/22/19 [phone]

## 2019-11-22 NOTE — ED Notes (Signed)
[name],RN aware of pt being in rm.

## 2020-07-12 IMAGING — DX DG ABDOMEN 1V
2 series · 2 of 2 positions shown · non-contrast
Comparison: None.

CLINICAL DATA: Flank pain, COVID positive

EXAM:
ABDOMEN - 1 VIEW

[abdomen supine (1 of 2)]
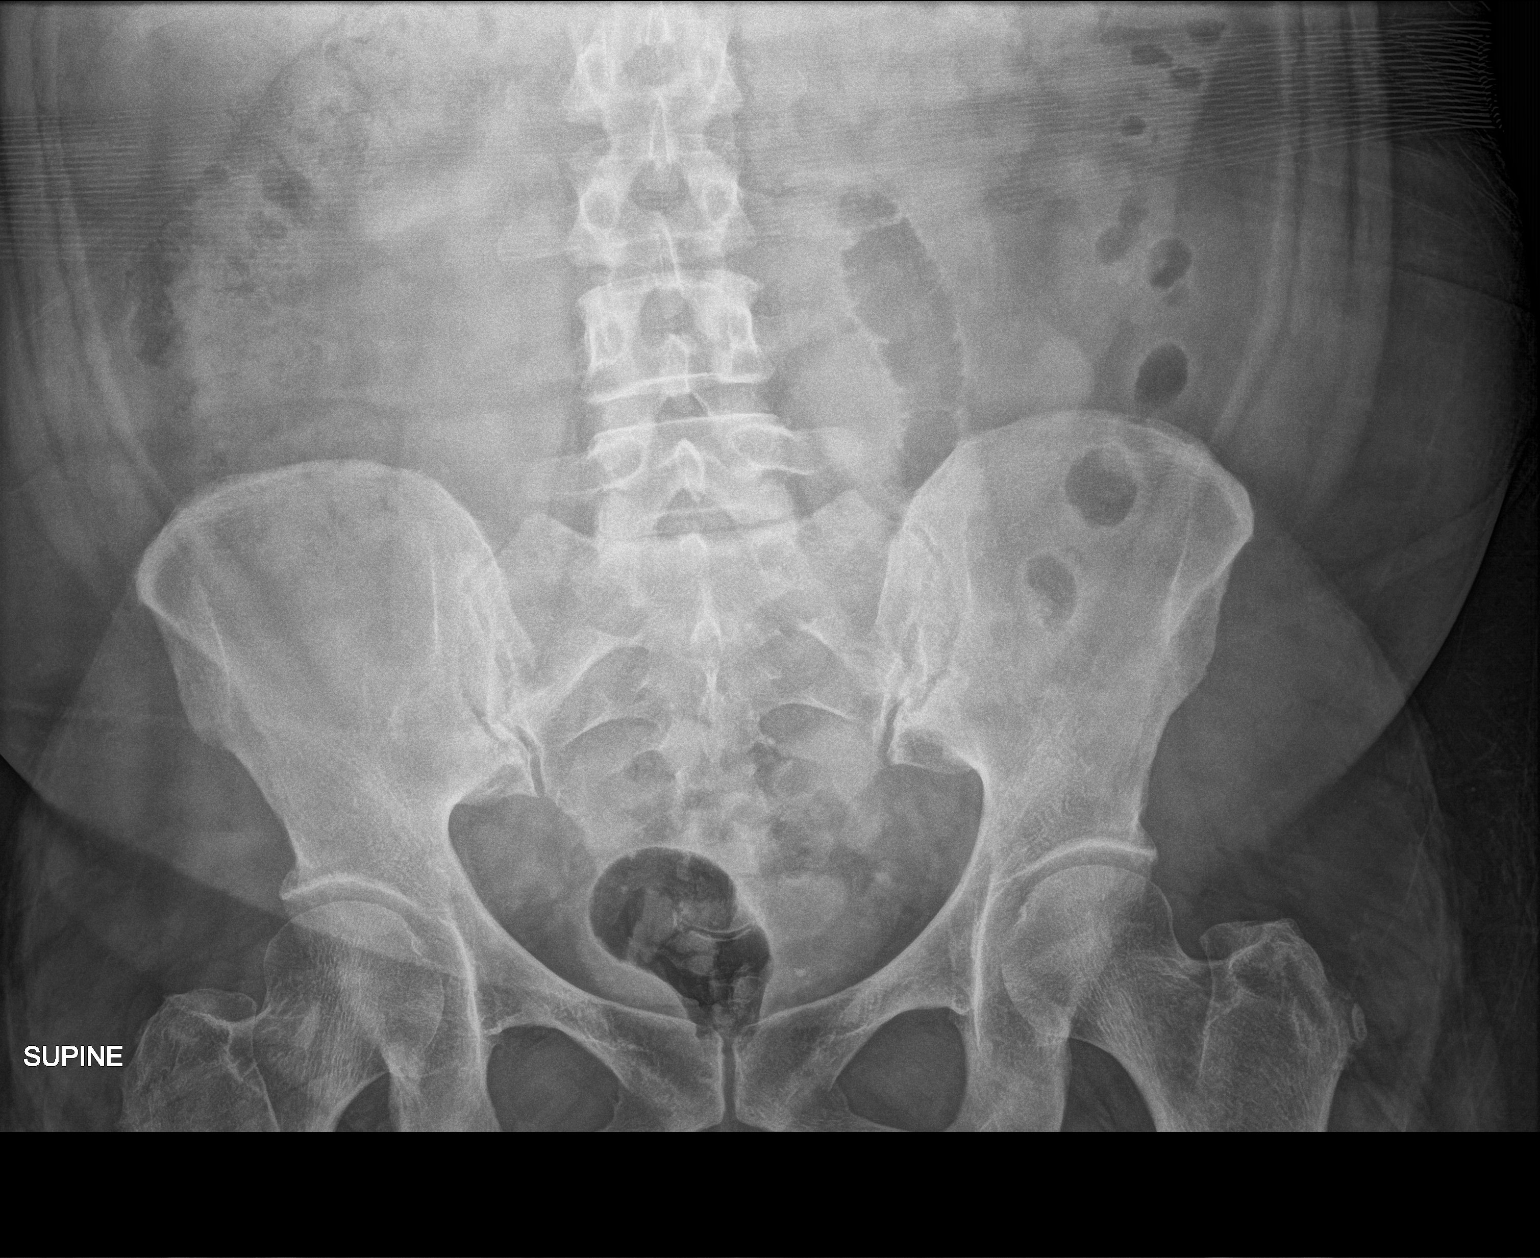

[abdomen supine (2 of 2)]
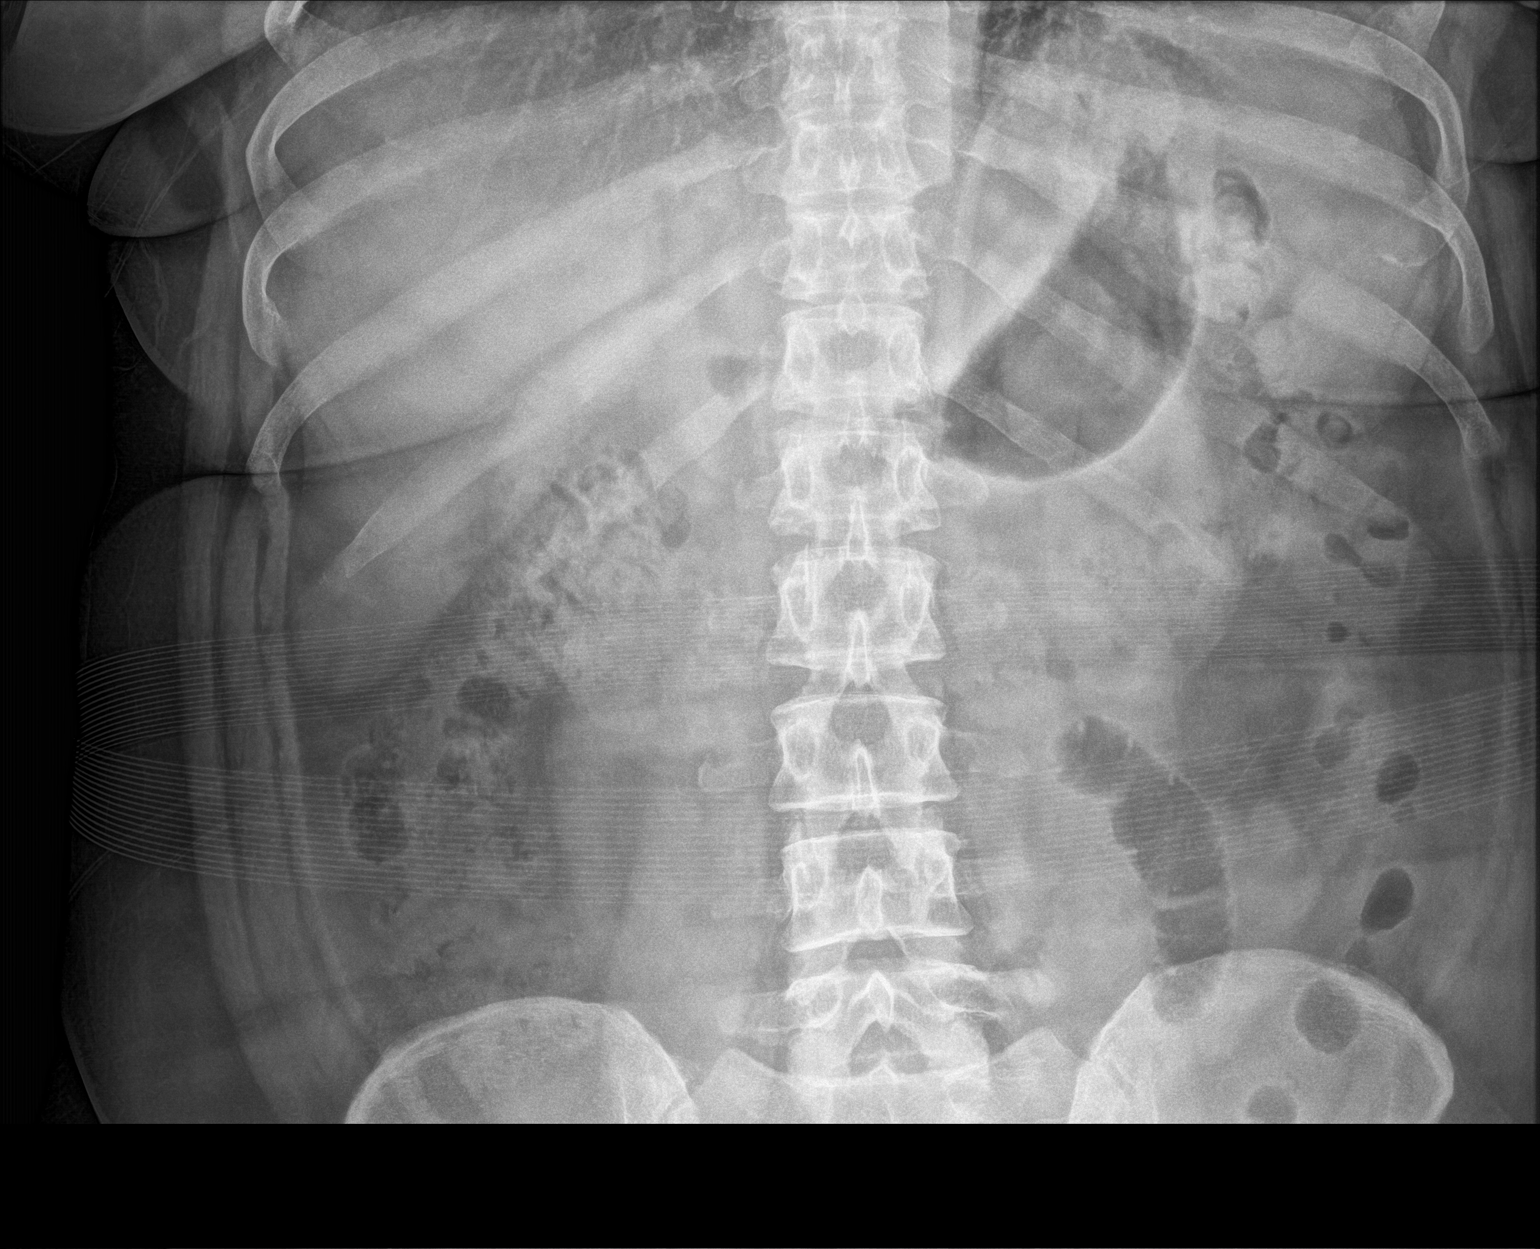

[2 of 2 positions shown; findings below may reference images not displayed]

FINDINGS: The bowel gas pattern is normal. No radio-opaque calculi or other
significant radiographic abnormality are seen.
IMPRESSION: Negative.

## 2020-07-12 IMAGING — CT CT RENAL STONE PROTOCOL
2 of 4 series · 16 of 46 positions shown, 18 images · non-contrast
Comparison: 09/28/2018.

CLINICAL DATA: Left flank and left lower quadrant abdomen pain for
the past 4 days. Suspected nephrolithiasis. History of breast
cancer.

EXAM:
CT ABDOMEN AND PELVIS WITHOUT CONTRAST
TECHNIQUE: Multidetector CT imaging of the abdomen and pelvis was performed
following the standard protocol without IV contrast.

[Series 3: stone full standard · axial · 0.82mm/px · z∈[-566,-126]mm · 13 of 96 slices shown, 15 images]
[im 4/96  soft-tissue]
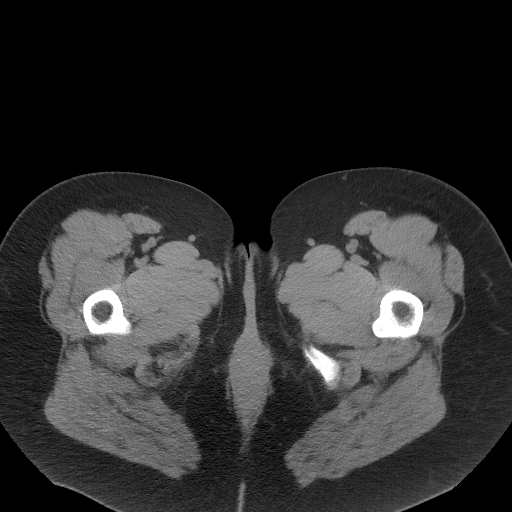
[im 4/96  bone]
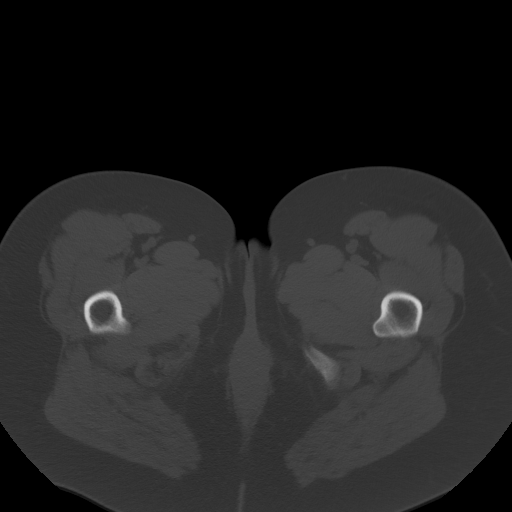
[im 12/96  soft-tissue]
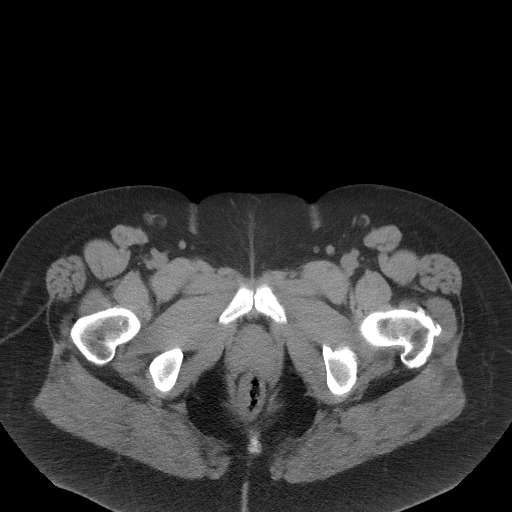
[im 20/96  soft-tissue]
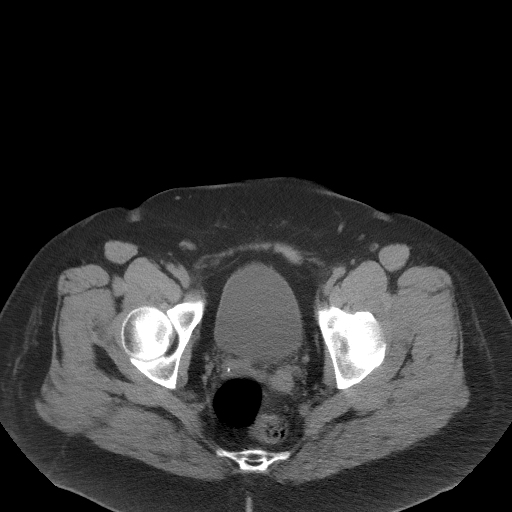
[im 28/96  soft-tissue]
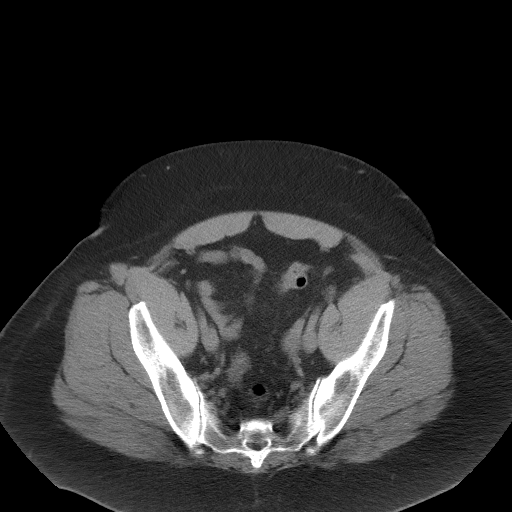
[im 32/96  soft-tissue]
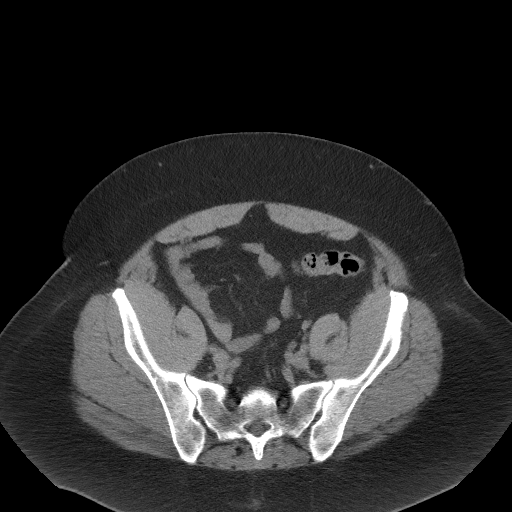
[im 40/96  soft-tissue]
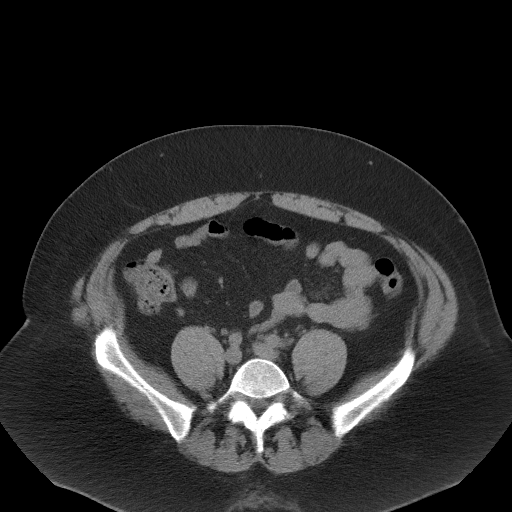
[im 48/96  soft-tissue]
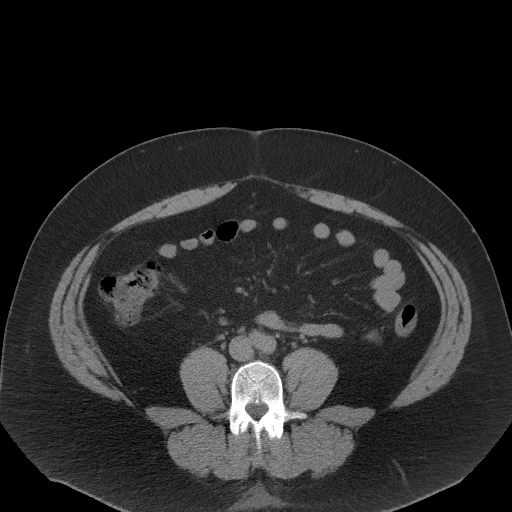
[im 56/96  soft-tissue]
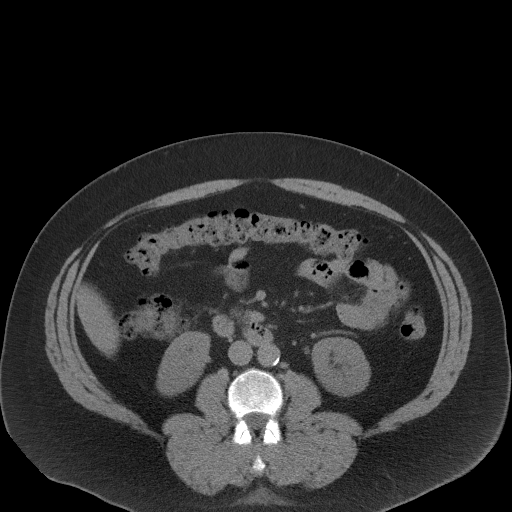
[im 64/96  soft-tissue]
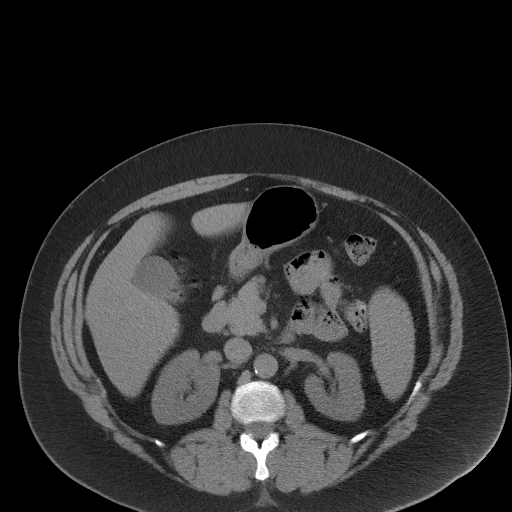
[im 64/96  bone]
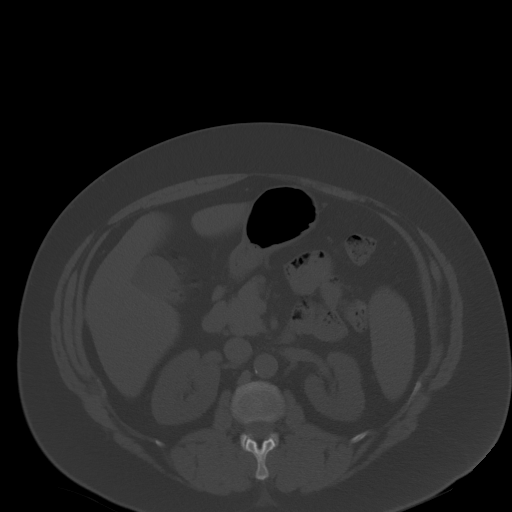
[im 68/96  soft-tissue]
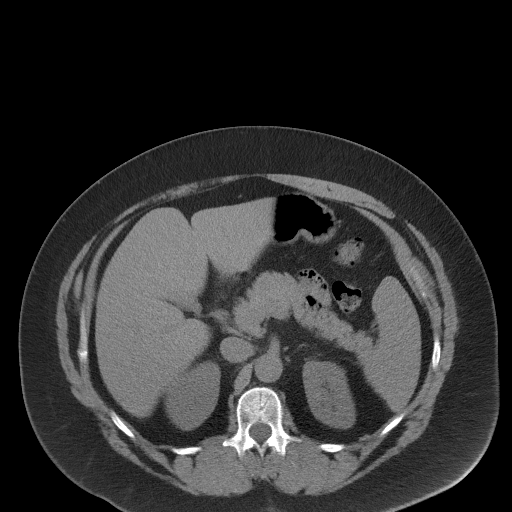
[im 76/96  soft-tissue]
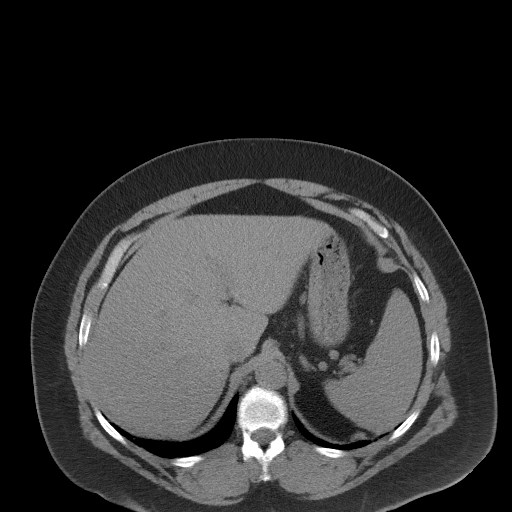
[im 84/96  soft-tissue]
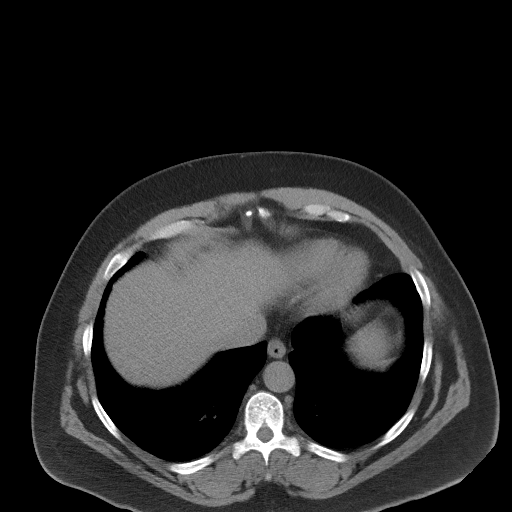
[im 92/96  soft-tissue]
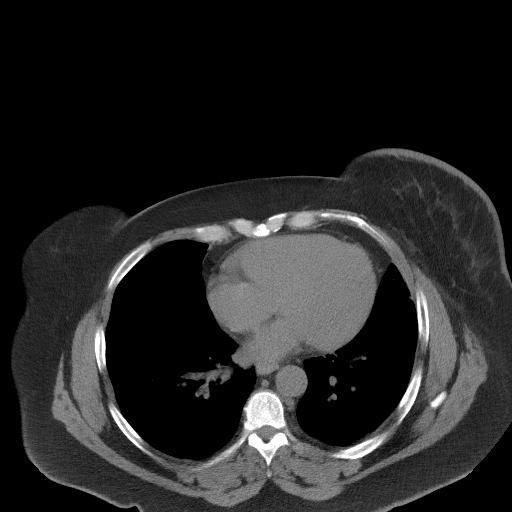

[Series 5: coronal · coronal · 0.80mm/px · 3 of 160 slices shown]
[im 54/160  soft-tissue]
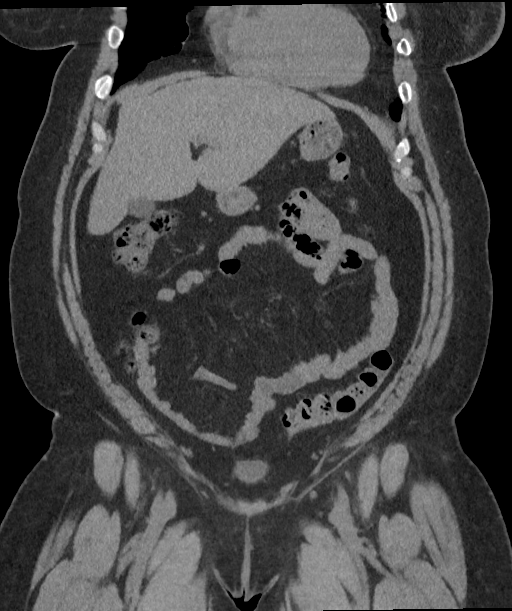
[im 71/160  soft-tissue]
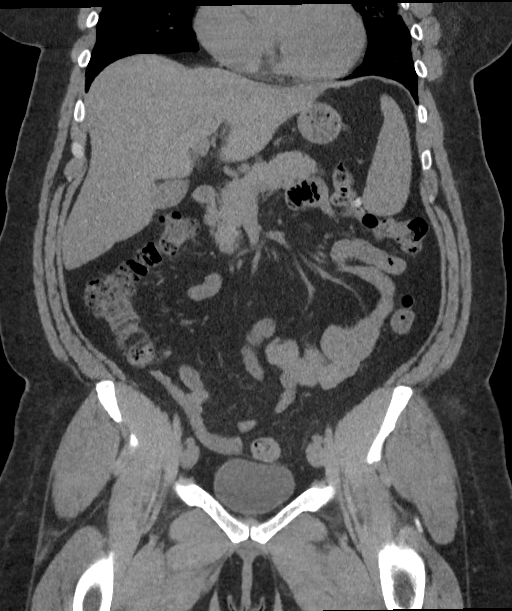
[im 89/160  soft-tissue]
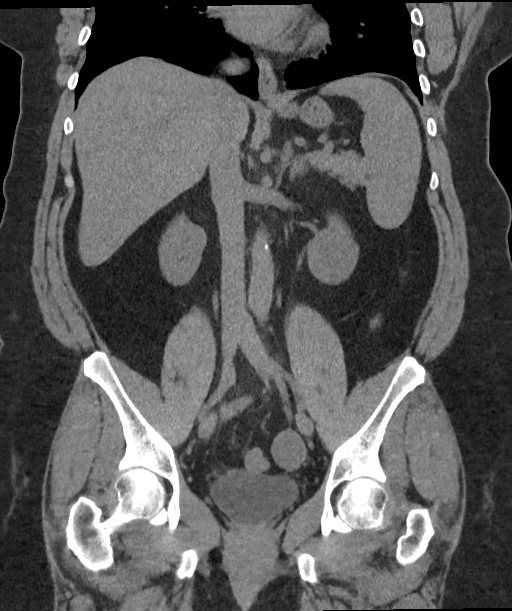

[16 of 46 positions shown; findings below may reference images not displayed]

FINDINGS: Lower chest: Interval patchy opacity in the lingula and mild patchy
opacities in both lower lobes and right middle lobe, most pronounced
in the left lower lobe and more pronounced in the periphery of the
lungs. No pleural fluid. Normal sized heart. Left breast
postradiation skin thickening.

Hepatobiliary: No focal liver abnormality is seen. No gallstones,
gallbladder wall thickening, or biliary dilatation.

Pancreas: Unremarkable. No pancreatic ductal dilatation or
surrounding inflammatory changes.

Spleen: Normal in size without focal abnormality.

Adrenals/Urinary Tract: Adrenal glands are unremarkable. Kidneys are
normal, without renal calculi, focal lesion, or hydronephrosis.
Bladder is unremarkable.

Stomach/Bowel: Stomach is within normal limits. Appendix appears
normal. No evidence of bowel wall thickening, distention, or
inflammatory changes.

Vascular/Lymphatic: Mild aortic atheromatous calcifications. No
aneurysm or enlarged lymph nodes.

Reproductive: Surgically absent uterus. 2.8 cm grossly simple left
ovarian cyst, unchanged.

Other: No abdominal wall hernia or abnormality. No abdominopelvic
ascites.

Musculoskeletal: Right femoral neck bone island. Minimal lumbar and
mild lower thoracic spine degenerative changes. No evidence of bony
metastatic disease.
IMPRESSION: 1. Multilobar patchy opacities at both lung bases, as described
above. This pattern can be seen with [id number] infection or other
multifocal pneumonia.
2. No acute abdominal or pelvic abnormality.
3. No urinary tract calculi or hydronephrosis.
4. Stable 2.8 cm grossly simple appearing left ovarian cyst. Again,
this could be better characterized with pelvic ultrasound.
